# Patient Record
Sex: Female | Born: 1950 | Race: Black or African American | Hispanic: No | Smoking: Never smoker
Health system: Southern US, Community
[De-identification: ages and names within clinical notes are randomized; demographics above are authoritative.]

## PROBLEM LIST (undated history)

## (undated) DIAGNOSIS — I2510 Atherosclerotic heart disease of native coronary artery without angina pectoris: Secondary | ICD-10-CM

## (undated) DIAGNOSIS — E119 Type 2 diabetes mellitus without complications: Secondary | ICD-10-CM

## (undated) DIAGNOSIS — E049 Nontoxic goiter, unspecified: Secondary | ICD-10-CM

## (undated) DIAGNOSIS — I10 Essential (primary) hypertension: Secondary | ICD-10-CM

## (undated) HISTORY — DX: Nontoxic goiter, unspecified: E04.9

## (undated) HISTORY — DX: Atherosclerotic heart disease of native coronary artery without angina pectoris: I25.10

## (undated) HISTORY — PX: CORONARY ANGIOPLASTY WITH STENT PLACEMENT: SHX49

---

## 2016-02-13 ENCOUNTER — Encounter (HOSPITAL_BASED_OUTPATIENT_CLINIC_OR_DEPARTMENT_OTHER): Payer: Self-pay

## 2016-02-13 ENCOUNTER — Emergency Department (HOSPITAL_BASED_OUTPATIENT_CLINIC_OR_DEPARTMENT_OTHER)
Admission: EM | Admit: 2016-02-13 | Discharge: 2016-02-13 | Disposition: A | Discharge status: Home / Self Care | Payer: BLUE CROSS/BLUE SHIELD | Attending: Emergency Medicine | Admitting: Emergency Medicine

## 2016-02-13 DIAGNOSIS — Z79899 Other long term (current) drug therapy: Secondary | ICD-10-CM | POA: Insufficient documentation

## 2016-02-13 DIAGNOSIS — Z7984 Long term (current) use of oral hypoglycemic drugs: Secondary | ICD-10-CM | POA: Diagnosis not present

## 2016-02-13 DIAGNOSIS — R739 Hyperglycemia, unspecified: Secondary | ICD-10-CM

## 2016-02-13 DIAGNOSIS — E1165 Type 2 diabetes mellitus with hyperglycemia: Secondary | ICD-10-CM | POA: Insufficient documentation

## 2016-02-13 DIAGNOSIS — I10 Essential (primary) hypertension: Secondary | ICD-10-CM | POA: Diagnosis not present

## 2016-02-13 DIAGNOSIS — R197 Diarrhea, unspecified: Secondary | ICD-10-CM | POA: Insufficient documentation

## 2016-02-13 DIAGNOSIS — N390 Urinary tract infection, site not specified: Secondary | ICD-10-CM

## 2016-02-13 HISTORY — DX: Essential (primary) hypertension: I10

## 2016-02-13 HISTORY — DX: Type 2 diabetes mellitus without complications: E11.9

## 2016-02-13 LAB — HEPATIC FUNCTION PANEL
ALBUMIN: 4 g/dL (ref 3.5–5.0)
ALT: 19 U/L (ref 14–54)
AST: 18 U/L (ref 15–41)
Alkaline Phosphatase: 86 U/L (ref 38–126)
BILIRUBIN TOTAL: 0.5 mg/dL (ref 0.3–1.2)
Bilirubin, Direct: 0.1 mg/dL (ref 0.1–0.5)
Indirect Bilirubin: 0.4 mg/dL (ref 0.3–0.9)
TOTAL PROTEIN: 8.1 g/dL (ref 6.5–8.1)

## 2016-02-13 LAB — URINE MICROSCOPIC-ADD ON

## 2016-02-13 LAB — CBG MONITORING, ED
GLUCOSE-CAPILLARY: 248 mg/dL — AB (ref 65–99)
Glucose-Capillary: 294 mg/dL — ABNORMAL HIGH (ref 65–99)

## 2016-02-13 LAB — BASIC METABOLIC PANEL
ANION GAP: 8 (ref 5–15)
BUN: 23 mg/dL — ABNORMAL HIGH (ref 6–20)
CO2: 28 mmol/L (ref 22–32)
Calcium: 9.6 mg/dL (ref 8.9–10.3)
Chloride: 97 mmol/L — ABNORMAL LOW (ref 101–111)
Creatinine, Ser: 0.61 mg/dL (ref 0.44–1.00)
GFR calc Af Amer: >60 mL/min (ref >60)
Glucose, Bld: 293 mg/dL — ABNORMAL HIGH (ref 65–99)
POTASSIUM: 3.7 mmol/L (ref 3.5–5.1)
SODIUM: 133 mmol/L — AB (ref 135–145)

## 2016-02-13 LAB — CBC
HEMATOCRIT: 37.1 % (ref 36.0–46.0)
HEMOGLOBIN: 12.4 g/dL (ref 12.0–15.0)
MCH: 26.7 pg (ref 26.0–34.0)
MCHC: 33.4 g/dL (ref 30.0–36.0)
MCV: 80 fL (ref 78.0–100.0)
Platelets: 312 10*3/uL (ref 150–400)
RBC: 4.64 MIL/uL (ref 3.87–5.11)
RDW: 13.7 % (ref 11.5–15.5)
WBC: 10.6 10*3/uL — AB (ref 4.0–10.5)

## 2016-02-13 LAB — URINALYSIS, ROUTINE W REFLEX MICROSCOPIC
Bilirubin Urine: NEGATIVE
Glucose, UA: >1000 mg/dL — AB
KETONES UR: NEGATIVE mg/dL
LEUKOCYTES UA: NEGATIVE
NITRITE: NEGATIVE
PH: 5.5 (ref 5.0–8.0)
Protein, ur: 100 mg/dL — AB
SPECIFIC GRAVITY, URINE: 1.025 (ref 1.005–1.030)

## 2016-02-13 LAB — LIPASE, BLOOD: LIPASE: 16 U/L (ref 11–51)

## 2016-02-13 MED ORDER — ONDANSETRON HCL 4 MG PO TABS: 4.0000 mg | ORAL_TABLET | Freq: Three times a day (TID) | ORAL | 0 refills | Status: DC | PRN

## 2016-02-13 MED ORDER — CEPHALEXIN 500 MG PO CAPS: 500.0000 mg | ORAL_CAPSULE | Freq: Three times a day (TID) | ORAL | 0 refills | Status: DC

## 2016-02-13 MED ORDER — SODIUM CHLORIDE 0.9 % IV BOLUS (SEPSIS)
500.0000 mL | Freq: Once | INTRAVENOUS | Status: AC
Start: 2016-02-13 — End: 2016-02-13
  Administered 2016-02-13: 500 mL via INTRAVENOUS

## 2016-02-13 MED FILL — CEPHALEXIN 500 MG CAPSULE: 500 | 7 days supply | Qty: 21 | Fill #0

## 2016-02-13 MED FILL — ONDANSETRON HCL 4 MG TABLET: 4 | 3 days supply | Qty: 9 | Fill #0

## 2016-02-13 NOTE — ED Notes (Addendum)
CBG completed by NT Foxx and edit sheet faxed

## 2016-02-13 NOTE — ED Provider Notes (Signed)
[facility] Note   CSN: [id number] Arrival date & time: 02/13/16  1100     History   Chief Complaint Chief Complaint  Patient presents with  . Hyperglycemia    HPI Ellen Morris is a 65 y.o. female.  HPI Patient has history of diabetes and hypertension. States she's been noncompliant with her medications intermittently taking them. She does not have a primary physician and the medications were prescribed some time ago. Her daughter is with her and is concerned because she is having persistent loose stools for the past 2 years. Had an episode of vomiting and diarrhea last night. She denies any abdominal pain, nausea currently. No recent fever or chills. No increased urinary frequency. No chest pain or shortness of breath. Past Medical History:  Diagnosis Date  . Diabetes mellitus without complication (HCC)   . Hypertension     There are no active problems to display for this patient.   Past Surgical History:  Procedure Laterality Date  . CORONARY ANGIOPLASTY WITH STENT PLACEMENT      OB History    No data available       Home Medications    Prior to Admission medications   Medication Sig Start Date End Date Taking? Authorizing Provider  glimepiride (AMARYL) 4 MG tablet Take 4 mg by mouth daily with breakfast.   Yes Historical Provider, MD  metoprolol succinate (TOPROL-XL) 25 MG 24 hr tablet Take 25 mg by mouth daily.   Yes Historical Provider, MD  cephALEXin (KEFLEX) 500 MG capsule Take 1 capsule (500 mg total) by mouth 3 (three) times daily. 02/13/16   Loren Racer, MD  ondansetron (ZOFRAN) 4 MG tablet Take 1 tablet (4 mg total) by mouth every 8 (eight) hours as needed for nausea or vomiting. 02/13/16   Loren Racer, MD    Family History No family history on file.  Social History Social History  Substance Use Topics  . Smoking status: Never Smoker  . Smokeless tobacco: Never Used  . Alcohol use No     Allergies   Review of  patient's allergies indicates no known allergies.   Review of Systems Review of Systems  Constitutional: Negative for chills and fever.  Eyes: Negative for visual disturbance.  Respiratory: Negative for cough and shortness of breath.   Cardiovascular: Negative for chest pain, palpitations and leg swelling.  Gastrointestinal: Positive for diarrhea, nausea and vomiting. Negative for abdominal pain and blood in stool.  Genitourinary: Negative for difficulty urinating, dysuria, flank pain and frequency.  Musculoskeletal: Negative for back pain, myalgias, neck pain and neck stiffness.  Skin: Negative for rash.  Neurological: Negative for dizziness, syncope, weakness, light-headedness, numbness and headaches.  All other systems reviewed and are negative.    Physical Exam Updated Vital Signs BP 164/92 (BP Location: Right Arm)   Pulse 82   Temp 98.1 F (36.7 C) (Oral)   Resp 18   SpO2 100%   Physical Exam  Constitutional: She is oriented to person, place, and time. She appears well-developed and well-nourished. No distress.  HENT:  Head: Normocephalic and atraumatic.  Mouth/Throat: Oropharynx is clear and moist.  Moist mucous membranes  Eyes: EOM are normal. Pupils are equal, round, and reactive to light.  Neck: Normal range of motion. Neck supple. No JVD present.  Cardiovascular: Normal rate, regular rhythm, normal heart sounds and intact distal pulses.  Exam reveals no gallop and no friction rub.   No murmur heard. Pulmonary/Chest: Effort normal and breath sounds normal. No  respiratory distress. She has no wheezes. She has no rales. She exhibits no tenderness.  Abdominal: Soft. Bowel sounds are normal. She exhibits no distension and no mass. There is no tenderness. There is no rebound and no guarding. No hernia.  Musculoskeletal: Normal range of motion. She exhibits no edema or tenderness.  No CVA tenderness. No lower extremity swelling, asymmetry or tenderness. Distal pulses are  2+.  Neurological: She is alert and oriented to person, place, and time.  5/5 motor in all extremities. Sensation is intact.  Skin: Skin is warm and dry. Capillary refill takes less than 2 seconds. No rash noted. She is not diaphoretic. No erythema.  Psychiatric: She has a normal mood and affect. Her behavior is normal.  Nursing note and vitals reviewed.    ED Treatments / Results  Labs (all labs ordered are listed, but only abnormal results are displayed) Labs Reviewed  BASIC METABOLIC PANEL  CBC  URINALYSIS, ROUTINE W REFLEX MICROSCOPIC (NOT AT [facility])  HEPATIC FUNCTION PANEL  LIPASE, BLOOD  CBG MONITORING, ED    EKG  EKG Interpretation None       Radiology No results found.  Procedures Procedures (including critical care time)  Medications Ordered in ED Medications  sodium chloride 0.9 % bolus 500 mL (0 mLs Intravenous Stopped 02/13/16 1241)     Initial Impression / Assessment and Plan / ED Course  I have reviewed the triage vital signs and the nursing notes.  Pertinent labs & imaging results that were available during my care of the patient were reviewed by me and considered in my medical decision making (see chart for details).  Clinical Course   Patient's well-appearing. Stable vital signs. Will check basic labs. I have reinforced the importance of establishing care with a primary physician to manage long-term medical conditions. Patient has verbally agreed that she will make an appointment to follow-up.  Final Clinical Impressions(s) / ED Diagnoses   Final diagnoses:  Hyperglycemia, unspecified  UTI (lower urinary tract infection)   Blood glucose is improved after IV fluids. Patient has many bacteria on her UA. States she's been having some urinary hesitancy. We'll treat for UTI. She will establish care with a primary physician in follow-up. Return precautions given. New Prescriptions New Prescriptions   CEPHALEXIN (KEFLEX) 500 MG CAPSULE    Take 1  capsule (500 mg total) by mouth 3 (three) times daily.   ONDANSETRON (ZOFRAN) 4 MG TABLET    Take 1 tablet (4 mg total) by mouth every 8 (eight) hours as needed for nausea or vomiting.     Loren Raceravid Kou Gucciardo, MD 02/13/16 [phone]

## 2016-02-13 NOTE — ED Notes (Addendum)
Patient reports that after eating certain foods like collard greens and peanuts she develops this uncontrollable diarrhea. Denies any food allergies. Denies any issues with spicy foods. Patient reports she does still have her gallbladder. Patient also reports generalized fatigue. Patient reports last night was having v/d together at same time.

## 2016-02-13 NOTE — ED Notes (Signed)
CBG 294 

## 2016-02-13 NOTE — ED Triage Notes (Addendum)
Pt c/o BS "out of whack for a long while"-states she takes oral meds-also c/o diarrhea weekly x "over a year"-does not have PCP-NAD-steady gait

## 2016-02-13 NOTE — ED Notes (Signed)
MD at bedside. 

## 2016-03-05 ENCOUNTER — Encounter: Payer: Self-pay | Admitting: Family

## 2016-03-05 ENCOUNTER — Telehealth: Payer: Self-pay | Admitting: Family

## 2016-03-05 ENCOUNTER — Ambulatory Visit (INDEPENDENT_AMBULATORY_CARE_PROVIDER_SITE_OTHER): Payer: BLUE CROSS/BLUE SHIELD | Admitting: Family

## 2016-03-05 VITALS — BP 158/75 | HR 90 | Temp 98.3°F | Resp 18 | Ht 63.5 in | Wt 146.2 lb

## 2016-03-05 DIAGNOSIS — E785 Hyperlipidemia, unspecified: Secondary | ICD-10-CM | POA: Diagnosis not present

## 2016-03-05 DIAGNOSIS — E113393 Type 2 diabetes mellitus with moderate nonproliferative diabetic retinopathy without macular edema, bilateral: Secondary | ICD-10-CM

## 2016-03-05 DIAGNOSIS — E119 Type 2 diabetes mellitus without complications: Secondary | ICD-10-CM | POA: Insufficient documentation

## 2016-03-05 DIAGNOSIS — Z9861 Coronary angioplasty status: Secondary | ICD-10-CM | POA: Insufficient documentation

## 2016-03-05 DIAGNOSIS — I2510 Atherosclerotic heart disease of native coronary artery without angina pectoris: Secondary | ICD-10-CM

## 2016-03-05 DIAGNOSIS — I25810 Atherosclerosis of coronary artery bypass graft(s) without angina pectoris: Secondary | ICD-10-CM | POA: Diagnosis not present

## 2016-03-05 DIAGNOSIS — I10 Essential (primary) hypertension: Secondary | ICD-10-CM | POA: Diagnosis not present

## 2016-03-05 DIAGNOSIS — E113599 Type 2 diabetes mellitus with proliferative diabetic retinopathy without macular edema, unspecified eye: Secondary | ICD-10-CM

## 2016-03-05 DIAGNOSIS — E1165 Type 2 diabetes mellitus with hyperglycemia: Secondary | ICD-10-CM | POA: Diagnosis not present

## 2016-03-05 DIAGNOSIS — E1139 Type 2 diabetes mellitus with other diabetic ophthalmic complication: Secondary | ICD-10-CM | POA: Insufficient documentation

## 2016-03-05 HISTORY — DX: Atherosclerotic heart disease of native coronary artery without angina pectoris: I25.10

## 2016-03-05 LAB — BASIC METABOLIC PANEL
BUN: 15 mg/dL (ref 6–23)
CALCIUM: 9.4 mg/dL (ref 8.4–10.5)
CO2: 31 mEq/L (ref 19–32)
Chloride: 99 mEq/L (ref 96–112)
Creatinine, Ser: 0.6 mg/dL (ref 0.40–1.20)
GFR: 129.21 mL/min (ref >60.00)
GLUCOSE: 208 mg/dL — AB (ref 70–99)
POTASSIUM: 3.7 meq/L (ref 3.5–5.1)
SODIUM: 137 meq/L (ref 135–145)

## 2016-03-05 LAB — HEPATIC FUNCTION PANEL
ALBUMIN: 3.7 g/dL (ref 3.5–5.2)
ALK PHOS: 92 U/L (ref 39–117)
ALT: 18 U/L (ref 0–35)
AST: 14 U/L (ref 0–37)
Bilirubin, Direct: 0 mg/dL (ref 0.0–0.3)
TOTAL PROTEIN: 7.5 g/dL (ref 6.0–8.3)
Total Bilirubin: 0.3 mg/dL (ref 0.2–1.2)

## 2016-03-05 LAB — MICROALBUMIN / CREATININE URINE RATIO
CREATININE, U: 94.1 mg/dL
MICROALB UR: 39.9 mg/dL — AB (ref 0.0–1.9)
MICROALB/CREAT RATIO: 42.4 mg/g — AB (ref 0.0–30.0)

## 2016-03-05 LAB — HEMOGLOBIN A1C: HEMOGLOBIN A1C: 8.5 % — AB (ref 4.6–6.5)

## 2016-03-05 MED ORDER — LOSARTAN POTASSIUM 25 MG PO TABS: 25.0000 mg | ORAL_TABLET | Freq: Every day | ORAL | 3 refills | Status: DC

## 2016-03-05 MED ORDER — SITAGLIPTIN PHOSPHATE 100 MG PO TABS
100.0000 mg | ORAL_TABLET | Freq: Every day | ORAL | 5 refills | Status: DC
Start: 2016-03-05 — End: 2017-07-11

## 2016-03-05 NOTE — Telephone Encounter (Signed)
Rx faxed to below pharmacy. Pt also left her medications in the exam room today. Advised pt I would leave them at the front desk for pick up and she states she will pick them up today.

## 2016-03-05 NOTE — Progress Notes (Signed)
Subjective:    Patient ID: Ellen Morris, female    DOB: 08-02-1950, 65 y.o.   MRN: [id number]  HPI  [name] is a 65 yr old female who presents today to establish care.    DM2- reports that she was diagnosed >10 yrs ago.  She takes amaryl. Reports that she checks her sugars frequently.  Reports that last night sugar before dinner was 136. Was 229  This AM. Had her eyes examined last month and was told that she has diabetic retinopathy.  She restarted medications in March.  She eats oatmeal for breakfast.   HTN- She has not taken metoprolol in 2 weeks.  Notes that it makes her tired. Intolerant to lisinopril, she denies hx of tongue/lip swelling on lisinopril. No hives or rash.   BP Readings from Last 3 Encounters:  03/05/16 (!) 158/75  02/13/16 148/78   CAD- She denies chest pain.  She would like referral to cardiology.   Review of Systems See HPI    Past Medical History:  Diagnosis Date  . Diabetes mellitus without complication (HCC)   . Hypertension      Social History   Social History  . Marital status: Single    Spouse name: N/A  . Number of children: N/A  . Years of education: N/A   Occupational History  . Not on file.   Social History Main Topics  . Smoking status: Never Smoker  . Smokeless tobacco: Never Used  . Alcohol use Yes     Comment: occasional wine  . Drug use: No  . Sexual activity: Not on file   Other Topics Concern  . 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     Past Surgical History:  Procedure Laterality Date  . CORONARY ANGIOPLASTY WITH STENT PLACEMENT      Family History  Problem Relation Age of Onset  . Diabetes Mother   . Hypertension Mother   . Heart disease Father     Allergies  Allergen Reactions  . Metformin Diarrhea  . Atorvastatin     Other reaction(s): Other (See  Comments) Severe fatigue and bones hurt  . Lisinopril     "made me feel funny"    Current Outpatient Prescriptions on File Prior to Visit  Medication Sig Dispense Refill  . glimepiride (AMARYL) 4 MG tablet Take 4 mg by mouth daily with breakfast.     No current facility-administered medications on file prior to visit.     BP (!) 158/75   Pulse 90   Temp 98.3 F (36.8 C) (Oral)   Resp 18   Ht 5' 3.5" (1.613 m)   Wt 146 lb 3.2 oz (66.3 kg)   SpO2 100% Comment: room air  BMI 25.49 kg/m    Objective:   Physical Exam  Constitutional: She is oriented to person, place, and time. She appears well-developed and well-nourished.  HENT:  Right Ear: Tympanic membrane and ear canal normal.  Left Ear: Tympanic membrane and ear canal normal.  Mouth/Throat: No oropharyngeal exudate, posterior oropharyngeal edema or posterior oropharyngeal erythema.  Cardiovascular: Normal rate, regular rhythm and normal heart sounds.   No murmur heard. Pulmonary/Chest: Effort normal and breath sounds normal. No respiratory distress. She has no wheezes.  Lymphadenopathy:    She has no cervical adenopathy.  Neurological: She is alert and oriented to person, place, and time.  Psychiatric: She has a normal mood and affect. Her behavior is normal. Judgment and thought content normal.          Assessment & Plan:  30 min spent with pt today. >50% of this time was spent counseling patient on diabetes, hypertension and diabetic diet.

## 2016-03-05 NOTE — Patient Instructions (Addendum)
Stop metoprolol, start losartan. Add walking 30 minutes walking 5 days a week.  Welcome to [facility]

## 2016-03-05 NOTE — Assessment & Plan Note (Signed)
Clinically stable. Refer to cardiology for ongoing management.  Continue aspirin.  May need to add back in beta blocker due to CAD next visit if BP remains elevated. Also, lipid panel was not processed today. Will need to obtain next visit.

## 2016-03-05 NOTE — Telephone Encounter (Signed)
Pt would like to have a Rx go to her local pharmacy. She says that it will take a few days before she receive medication if it travels via [facility]?  (losartan)   [facility] [address]

## 2016-03-05 NOTE — Telephone Encounter (Signed)
A1C is 8.5, needs to be <7. I would like her to continue amaryl, add [place]uvia once daily. There is some protein in her urine indicating some kidney damage from the diabetes.  The losartan that we added along with improved sugar control should decrease further damage to her kidneys.

## 2016-03-05 NOTE — Assessment & Plan Note (Signed)
Uncontrolled.  + urine microalbuminuria noted today. D/c metoprolol, begin losartan.

## 2016-03-05 NOTE — Progress Notes (Signed)
Pre visit review using our clinic review tool, if applicable. No additional management support is needed unless otherwise documented below in the visit note. 

## 2016-03-05 NOTE — Assessment & Plan Note (Signed)
Lab Results  Component Value Date   HGBA1C 8.5 (H) 03/05/2016   Follow up A1C is above goal on amaryl. Add [place]uvia. Discussed diabetic diet.

## 2016-03-06 NOTE — Telephone Encounter (Signed)
Left detailed message on voicemail per DPR and to call if any questions.

## 2016-03-22 ENCOUNTER — Ambulatory Visit (INDEPENDENT_AMBULATORY_CARE_PROVIDER_SITE_OTHER): Payer: BLUE CROSS/BLUE SHIELD | Admitting: Family

## 2016-03-22 ENCOUNTER — Encounter: Payer: Self-pay | Admitting: Family

## 2016-03-22 VITALS — BP 140/82 | HR 72 | Temp 98.1°F | Resp 16 | Ht 63.5 in | Wt 142.8 lb

## 2016-03-22 DIAGNOSIS — Z23 Encounter for immunization: Secondary | ICD-10-CM

## 2016-03-22 DIAGNOSIS — I159 Secondary hypertension, unspecified: Secondary | ICD-10-CM

## 2016-03-22 DIAGNOSIS — I10 Essential (primary) hypertension: Secondary | ICD-10-CM

## 2016-03-22 LAB — BASIC METABOLIC PANEL
BUN: 25 mg/dL — ABNORMAL HIGH (ref 6–23)
CALCIUM: 9.7 mg/dL (ref 8.4–10.5)
CO2: 29 mEq/L (ref 19–32)
Chloride: 101 mEq/L (ref 96–112)
Creatinine, Ser: 0.72 mg/dL (ref 0.40–1.20)
GFR: 104.68 mL/min (ref >60.00)
Glucose, Bld: 217 mg/dL — ABNORMAL HIGH (ref 70–99)
POTASSIUM: 3.8 meq/L (ref 3.5–5.1)
SODIUM: 136 meq/L (ref 135–145)

## 2016-03-22 NOTE — Progress Notes (Signed)
   Subjective:    Patient ID: Ellen Morris, female    DOB: Jul 05, 1950, 65 y.o.   MRN: [id number]  HPI  Ellen Morris is a 65 yr old female who presents today for follow up of her hypertension.  HTN- Currently maintained on losartan 25mg . This was added last visit due to microalbuminuria and uncontrolled HTN.  BP Readings from Last 3 Encounters:  03/22/16 140/82  03/05/16 (!) 158/75  02/13/16 148/78   Denies CP/SOB or swelling.      Review of Systems See HPI  Past Medical History:  Diagnosis Date  . CAD (coronary artery disease) 03/05/2016  . Diabetes mellitus without complication (HCC)   . Hypertension      Social History   Social History  . Marital status: Single    Spouse name: N/A  . Number of children: N/A  . Years of education: N/A   Occupational History  . Not on file.   Social History Main Topics  . Smoking status: Never Smoker  . Smokeless tobacco: Never Used  . Alcohol use Yes     Comment: occasional wine  . Drug use: No  . Sexual activity: Not on file   Other Topics Concern  . 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     Past Surgical History:  Procedure Laterality Date  . CORONARY ANGIOPLASTY WITH STENT PLACEMENT      Family History  Problem Relation Age of Onset  . Diabetes Mother   . Hypertension Mother   . Heart disease Father     Allergies  Allergen Reactions  . Metformin Diarrhea  . Atorvastatin     Other reaction(s): Other (See Comments) Severe fatigue and bones hurt  . Lisinopril     "made me feel funny"    Current Outpatient Prescriptions on File Prior to Visit  Medication Sig Dispense Refill  . aspirin EC 81 MG tablet Take 81 mg by mouth daily.    Marland Kitchen. glimepiride (AMARYL) 4 MG tablet Take 4 mg by mouth daily with breakfast.    . losartan (COZAAR) 25 MG tablet Take 1 tablet (25  mg total) by mouth daily. 30 tablet 3  . sitaGLIPtin (JANUVIA) 100 MG tablet Take 1 tablet (100 mg total) by mouth daily. 30 tablet 5   No current facility-administered medications on file prior to visit.     BP 140/82   Pulse 72   Temp 98.1 F (36.7 C) (Oral)   Resp 16   Ht 5' 3.5" (1.613 m)   Wt 142 lb 12.8 oz (64.8 kg)   SpO2 97% Comment: room air  BMI 24.90 kg/m       Objective:   Physical Exam  Constitutional: She appears well-developed and well-nourished.  Cardiovascular: Normal rate, regular rhythm and normal heart sounds.   No murmur heard. Pulmonary/Chest: Effort normal and breath sounds normal. No respiratory distress. She has no wheezes.  Musculoskeletal: She exhibits no edema.  Psychiatric: She has a normal mood and affect. Her behavior is normal. Judgment and thought content normal.          Assessment & Plan:

## 2016-03-22 NOTE — Patient Instructions (Signed)
Please complete lab work prior to leaving.   

## 2016-03-22 NOTE — Progress Notes (Signed)
Pre visit review using our clinic review tool, if applicable. No additional management support is needed unless otherwise documented below in the visit note. 

## 2016-03-23 NOTE — Assessment & Plan Note (Signed)
BP stable on losartan, continue same, obtain bmet.

## 2016-05-17 ENCOUNTER — Ambulatory Visit: Payer: BLUE CROSS/BLUE SHIELD | Admitting: Interventional Cardiology

## 2016-06-06 NOTE — Progress Notes (Deleted)
     HPI: 66 year old female for evaluation of coronary artery disease. [facility].   Current Outpatient Prescriptions  Medication Sig Dispense Refill  . aspirin EC 81 MG tablet Take 81 mg by mouth daily.    Marland Kitchen. glimepiride (AMARYL) 4 MG tablet Take 4 mg by mouth daily with breakfast.    . losartan (COZAAR) 25 MG tablet Take 1 tablet (25 mg total) by mouth daily. 30 tablet 3  . sitaGLIPtin (JANUVIA) 100 MG tablet Take 1 tablet (100 mg total) by mouth daily. 30 tablet 5   No current facility-administered medications for this visit.     Allergies  Allergen Reactions  . Metformin Diarrhea  . Atorvastatin     Other reaction(s): Other (See Comments) Severe fatigue and bones hurt  . Lisinopril     "made me feel funny"    Past Medical History:  Diagnosis Date  . CAD (coronary artery disease) 03/05/2016  . Diabetes mellitus without complication (HCC)   . Hypertension     Past Surgical History:  Procedure Laterality Date  . CORONARY ANGIOPLASTY WITH STENT PLACEMENT      Social History   Social History  . Marital status: Single    Spouse name: N/A  . Number of children: N/A  . Years of education: N/A   Occupational History  . Not on file.   Social History Main Topics  . Smoking status: Never Smoker  . Smokeless tobacco: Never Used  . Alcohol use Yes     Comment: occasional wine  . Drug use: No  . Sexual activity: Not on file   Other Topics Concern  . 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     Family History  Problem Relation Age of Onset  . Diabetes Mother   . Hypertension Mother   . Heart disease Father     ROS: no fevers or chills, productive cough, hemoptysis, dysphasia, odynophagia, melena, hematochezia, dysuria, hematuria, rash, seizure activity, orthopnea, PND, pedal edema,  claudication. Remaining systems are negative.  Physical Exam:   There were no vitals taken for this visit.  General:  Well developed/well nourished in NAD Skin warm/dry Patient not depressed No peripheral clubbing Back-normal HEENT-normal/normal eyelids Neck supple/normal carotid upstroke bilaterally; no bruits; no JVD; no thyromegaly chest - CTA/ normal expansion CV - RRR/normal S1 and S2; no murmurs, rubs or gallops;  PMI nondisplaced Abdomen -NT/ND, no HSM, no mass, + bowel sounds, no bruit 2+ femoral pulses, no bruits Ext-no edema, chords, 2+ DP Neuro-grossly nonfocal  ECG

## 2016-06-07 ENCOUNTER — Ambulatory Visit (INDEPENDENT_AMBULATORY_CARE_PROVIDER_SITE_OTHER): Payer: BLUE CROSS/BLUE SHIELD | Admitting: Family

## 2016-06-07 ENCOUNTER — Encounter: Payer: Self-pay | Admitting: Family

## 2016-06-07 VITALS — BP 155/98 | HR 94 | Temp 98.2°F | Ht 63.5 in | Wt 144.0 lb

## 2016-06-07 DIAGNOSIS — R197 Diarrhea, unspecified: Secondary | ICD-10-CM | POA: Diagnosis not present

## 2016-06-07 DIAGNOSIS — E1165 Type 2 diabetes mellitus with hyperglycemia: Secondary | ICD-10-CM

## 2016-06-07 DIAGNOSIS — E113599 Type 2 diabetes mellitus with proliferative diabetic retinopathy without macular edema, unspecified eye: Secondary | ICD-10-CM

## 2016-06-07 DIAGNOSIS — I10 Essential (primary) hypertension: Secondary | ICD-10-CM | POA: Diagnosis not present

## 2016-06-07 DIAGNOSIS — E049 Nontoxic goiter, unspecified: Secondary | ICD-10-CM

## 2016-06-07 LAB — CBC WITH DIFFERENTIAL/PLATELET
BASOS ABS: 0 {cells}/uL (ref 0–200)
Basophils Relative: 0 %
EOS ABS: 84 {cells}/uL (ref 15–500)
Eosinophils Relative: 1 %
HEMATOCRIT: 37.3 % (ref 35.0–45.0)
Hemoglobin: 11.8 g/dL (ref 11.7–15.5)
LYMPHS PCT: 29 %
Lymphs Abs: 2436 cells/uL (ref 850–3900)
MCH: 26.5 pg — ABNORMAL LOW (ref 27.0–33.0)
MCHC: 31.6 g/dL — AB (ref 32.0–36.0)
MCV: 83.6 fL (ref 80.0–100.0)
MONO ABS: 756 {cells}/uL (ref 200–950)
MONOS PCT: 9 %
MPV: 10.2 fL (ref 7.5–12.5)
Neutro Abs: 5124 cells/uL (ref 1500–7800)
Neutrophils Relative %: 61 %
PLATELETS: 329 10*3/uL (ref 140–400)
RBC: 4.46 MIL/uL (ref 3.80–5.10)
RDW: 13.7 % (ref 11.0–15.0)
WBC: 8.4 10*3/uL (ref 3.8–10.8)

## 2016-06-07 LAB — COMPREHENSIVE METABOLIC PANEL
ALBUMIN: 3.8 g/dL (ref 3.6–5.1)
ALK PHOS: 100 U/L (ref 33–130)
ALT: 14 U/L (ref 6–29)
AST: 13 U/L (ref 10–35)
BILIRUBIN TOTAL: 0.3 mg/dL (ref 0.2–1.2)
BUN: 22 mg/dL (ref 7–25)
CALCIUM: 9.8 mg/dL (ref 8.6–10.4)
CO2: 27 mmol/L (ref 20–31)
CREATININE: 0.64 mg/dL (ref 0.50–0.99)
Chloride: 102 mmol/L (ref 98–110)
GLUCOSE: 234 mg/dL — AB (ref 65–99)
Potassium: 4.1 mmol/L (ref 3.5–5.3)
SODIUM: 136 mmol/L (ref 135–146)
Total Protein: 7.6 g/dL (ref 6.1–8.1)

## 2016-06-07 LAB — T3, FREE: T3 FREE: 3.2 pg/mL (ref 2.3–4.2)

## 2016-06-07 LAB — HEMOGLOBIN A1C
Hgb A1c MFr Bld: 9.1 % — ABNORMAL HIGH (ref <5.7)
Mean Plasma Glucose: 214 mg/dL

## 2016-06-07 LAB — TSH: TSH: 0.45 m[IU]/L

## 2016-06-07 LAB — T4, FREE: Free T4: 1.3 ng/dL (ref 0.8–1.8)

## 2016-06-07 MED ORDER — LOSARTAN POTASSIUM 50 MG PO TABS: 50.0000 mg | ORAL_TABLET | Freq: Every day | ORAL | 0 refills | Status: DC

## 2016-06-07 NOTE — Progress Notes (Signed)
Pre visit review using our clinic tool,if applicable. No additional management support is needed unless otherwise documented below in the visit note.  

## 2016-06-07 NOTE — Patient Instructions (Addendum)
We have increased your Losartan to 50 mg daily. You may take two of your 25 mg Losartans if you'd like. Continue taking this medication. If you develop headaches, slurred speech, weakness, blurred vision --> go to the emergency room!  Start Januvia tomorrow and then start Amaryl a few days later, call Melissa if recurrent diarrhea.  Make a follow-up appointment to see Melissa in 2 weeks.  Complete your lab work and stool studies.  You will be contacted about performing the ultrasound for your thyroid.

## 2016-06-07 NOTE — Progress Notes (Signed)
Subjective:    Patient ID: Leo Rodamela Celaya, female    DOB: 1950-10-15, 66 y.o.   MRN: [id number]  HPI  Ms. Jenelle MagesHairston is a 66 y/o female who presents with two chief complaints:  1) Goiter - On January 3rd, she fell at work. They completed an xray, and they incidentally found a goiter on the right side of her neck and she is here today for a referral and follow-up on that. She denies any weight loss, temperature intolerances at this time.  2) Diarrhea - She has been having debilitating uncontrollable diarrhea since October. She states that about two weeks after starting both the Cozaar and Januvia she noticed she had episodes of diarrhea. Once the episodes started they would last for several hours, at least twelve hours, and it would keep her running to the bathroom to the point where she would have to wear Depends and was unable to travel far from home. She tried Imodium, which helped, but didn't want to have to take it all of the time so she stopped taking it. She states that she has been able to stay well hydrated. She does notice that broccoli, nuts and popcorn makes it worse. She denies fever, cramping, weight loss, night sweats, sick contacts, blood in diarrhea. She did have two episodes of vomiting a month or two ago, non-bilious non-bloody. She does find that eating with the medication seemed to help her symptoms some of the time. Last colonoscopy in 2013, was told to repeat in 2023 as there were no findings. She states that prior to cozaar she was on metoprolol and notes that she had diarrhea on that medication as well. She has checked her blood sugars two weeks ago and they ranged from 120-130. She states that other than taking her Cozaar once today, she has not taken any of her medications in two weeks and has not had any diarrhea since doing so.  Review of Systems  See HPI  Past Medical History:  Diagnosis Date  . CAD (coronary artery disease) 03/05/2016  . Diabetes mellitus without  complication (HCC)   . Hypertension      Social History   Social History  . Marital status: Single    Spouse name: N/A  . Number of children: N/A  . Years of education: N/A   Occupational History  . Not on file.   Social History Main Topics  . Smoking status: Never Smoker  . Smokeless tobacco: Never Used  . Alcohol use Yes     Comment: occasional wine  . Drug use: No  . Sexual activity: Not on file   Other Topics Concern  . 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     Past Surgical History:  Procedure Laterality Date  . CORONARY ANGIOPLASTY WITH STENT PLACEMENT      Family History  Problem Relation Age of Onset  . Diabetes Mother   . Hypertension Mother   . Heart disease Father     Allergies  Allergen Reactions  . Metformin Diarrhea  . Atorvastatin     Other reaction(s): Other (See Comments) Severe fatigue and bones hurt  . Lisinopril     "made me feel funny"    Current Outpatient Prescriptions on File Prior to Visit  Medication Sig Dispense Refill  . aspirin  EC 81 MG tablet Take 81 mg by mouth daily.    Marland Kitchen glimepiride (AMARYL) 4 MG tablet Take 4 mg by mouth daily with breakfast.    . losartan (COZAAR) 25 MG tablet Take 1 tablet (25 mg total) by mouth daily. 30 tablet 3  . sitaGLIPtin (JANUVIA) 100 MG tablet Take 1 tablet (100 mg total) by mouth daily. 30 tablet 5   No current facility-administered medications on file prior to visit.     Pulse 94   Temp 98.2 F (36.8 C) (Oral)   Ht 5' 3.5" (1.613 m)   Wt 144 lb (65.3 kg)   SpO2 98%   BMI 25.11 kg/m   BP 155/98    Objective:   Physical Exam  Constitutional: She appears well-developed and well-nourished.  HENT:  Head: Normocephalic and atraumatic.  Neck: Trachea normal. Mass: very slight, R lower side of anterior area of neck.    Cardiovascular:  Normal rate, regular rhythm and normal heart sounds.   Pulmonary/Chest: Effort normal and breath sounds normal.  Abdominal: Soft. Normal appearance and bowel sounds are normal. There is no tenderness.  Nursing note and vitals reviewed.      Assessment & Plan:  1. Diarrhea, unspecified type Unable to determine if medication related, however it is possible given no symptoms while not on any medications. Will rule out infectious or other component with lab testing. Patient to complete stool culture. - Comprehensive metabolic panel - Lipase - CBC with Differential/Platelet - Hemoglobin A1c - Ova and parasite examination - Clostridium Difficile by PCR - Stool Culture  2. Goiter No current symptoms. Will schedule routine thyroid blood work and ultrasound. Further work-up pending results. - TSH - T3, free - T4, free - US THYROID; Future  3. Essential hypertension Blood pressure today is elevated, and was checked several times. Discussed need to take BP medications consistently. Increased Cozaar dose to 50mg . Patient to follow up with Sandford Craze in two weeks. Advised as follows:  If you develop headaches, slurred speech, weakness, blurred vision --> go to the emergency room!   4. 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 Will re-check today. Patient is to restart Januvia and then if tolerated, restart Amaryl. If diarrhea develops, patient is to contact Melissa. Check blood sugars daily. Follow-up in two weeks.  Jarold Motto PA-C 06/07/16

## 2016-06-08 LAB — LIPASE: Lipase: <5 U/L — ABNORMAL LOW (ref 7–60)

## 2016-06-09 ENCOUNTER — Ambulatory Visit (HOSPITAL_BASED_OUTPATIENT_CLINIC_OR_DEPARTMENT_OTHER)
Admission: RE | Admit: 2016-06-09 | Discharge: 2016-06-09 | Disposition: A | Discharge status: Home / Self Care | Payer: BLUE CROSS/BLUE SHIELD | Source: Ambulatory Visit | Attending: Physician Assistant | Admitting: Physician Assistant

## 2016-06-09 DIAGNOSIS — E042 Nontoxic multinodular goiter: Secondary | ICD-10-CM | POA: Insufficient documentation

## 2016-06-09 DIAGNOSIS — E049 Nontoxic goiter, unspecified: Secondary | ICD-10-CM | POA: Diagnosis present

## 2016-06-11 ENCOUNTER — Other Ambulatory Visit: Payer: Self-pay | Admitting: Physician Assistant

## 2016-06-11 DIAGNOSIS — E1165 Type 2 diabetes mellitus with hyperglycemia: Principal | ICD-10-CM

## 2016-06-11 DIAGNOSIS — E113393 Type 2 diabetes mellitus with moderate nonproliferative diabetic retinopathy without macular edema, bilateral: Secondary | ICD-10-CM

## 2016-06-17 ENCOUNTER — Ambulatory Visit: Payer: BLUE CROSS/BLUE SHIELD | Admitting: Cardiology

## 2016-06-21 ENCOUNTER — Ambulatory Visit: Payer: BLUE CROSS/BLUE SHIELD | Admitting: Family

## 2016-06-21 ENCOUNTER — Encounter: Payer: Self-pay | Admitting: Family

## 2016-06-21 ENCOUNTER — Ambulatory Visit (INDEPENDENT_AMBULATORY_CARE_PROVIDER_SITE_OTHER): Payer: BLUE CROSS/BLUE SHIELD | Admitting: Family

## 2016-06-21 VITALS — BP 137/84 | HR 85 | Temp 98.0°F | Resp 16 | Ht 63.5 in | Wt 146.2 lb

## 2016-06-21 DIAGNOSIS — I10 Essential (primary) hypertension: Secondary | ICD-10-CM

## 2016-06-21 DIAGNOSIS — E042 Nontoxic multinodular goiter: Secondary | ICD-10-CM

## 2016-06-21 DIAGNOSIS — E1165 Type 2 diabetes mellitus with hyperglycemia: Secondary | ICD-10-CM

## 2016-06-21 DIAGNOSIS — E113393 Type 2 diabetes mellitus with moderate nonproliferative diabetic retinopathy without macular edema, bilateral: Secondary | ICD-10-CM

## 2016-06-21 DIAGNOSIS — E785 Hyperlipidemia, unspecified: Secondary | ICD-10-CM

## 2016-06-21 NOTE — Assessment & Plan Note (Signed)
Lab Results  Component Value Date   TSH 0.45 06/07/2016   Advised pt to follow through with endocrinology referral.

## 2016-06-21 NOTE — Assessment & Plan Note (Signed)
BP improved today on increased dose of losartan.

## 2016-06-21 NOTE — Assessment & Plan Note (Signed)
Improving with improved diet and back on meds. I encouraged pt to keep up the good work with diet and med compliance. I don't think that her diarrhea symptoms were med related- monitor.

## 2016-06-21 NOTE — Patient Instructions (Addendum)
Please complete lab work prior to leaving. Continue your work on diabetic diet.  Keep your upcoming appointment with endocrinology.  Follow up in 3 months.

## 2016-06-21 NOTE — Progress Notes (Signed)
Pre visit review using our clinic review tool, if applicable. No additional management support is needed unless otherwise documented below in the visit note. 

## 2016-06-21 NOTE — Progress Notes (Signed)
Dictation #1 [id number]  [id number]  Subjective:    Patient ID: Ellen Morris, female    DOB: 24-Apr-1951, 66 y.o.   MRN: [id number]  HPI  Ellen Morris is a 66 yr old female who presents today for follow up.  1) DM2- last visit she was referred to endocrinology due to her worsening diabetic control. She reports that she has been cutting back on the foods that she eats.  "I am now accepting the fact that I am a diabetic." Reports she is motivated to take better care of herself. Reports that her fasting sugars are ranging 116-140's.   Lab Results  Component Value Date   HGBA1C 9.1 (H) 06/07/2016   HGBA1C 8.5 (H) 03/05/2016   Lab Results  Component Value Date   MICROALBUR 39.9 (H) 03/05/2016   CREATININE 0.64 06/07/2016   2) HTN- currently maintained on losartan 50mg .  BP Readings from Last 3 Encounters:  06/21/16 137/84  06/07/16 (!) 155/98  03/22/16 140/82   3) goiter- last visit she was noted to have a multinodular goiter and was referred to endocrinology.   4) Diarrhea- she reports resolution of her diarrhea. She has resumed all her meds.    Review of Systems    see HPI  Past Medical History:  Diagnosis Date  . CAD (coronary artery disease) 03/05/2016  . Diabetes mellitus without complication (HCC)   . Hypertension      Social History   Social History  . Marital status: Single    Spouse name: N/A  . Number of children: N/A  . Years of education: N/A   Occupational History  . Not on file.   Social History Main Topics  . Smoking status: Never Smoker  . Smokeless tobacco: Never Used  . Alcohol use Yes     Comment: occasional wine  . Drug use: No  . Sexual activity: Not on file   Other Topics Concern  . 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     Past Surgical History:  Procedure Laterality Date  .  CORONARY ANGIOPLASTY WITH STENT PLACEMENT      Family History  Problem Relation Age of Onset  . Diabetes Mother   . Hypertension Mother   . Heart disease Father     Allergies  Allergen Reactions  . Metformin Diarrhea  . Atorvastatin     Other reaction(s): Other (See Comments) Severe fatigue and bones hurt  . Lisinopril     "made me feel funny"    Current Outpatient Prescriptions on File Prior to Visit  Medication Sig Dispense Refill  . aspirin EC 81 MG tablet Take 81 mg by mouth daily.    Marland Kitchen glimepiride (AMARYL) 4 MG tablet Take 4 mg by mouth daily with breakfast.    . losartan (COZAAR) 50 MG tablet Take 1 tablet (50 mg total) by mouth daily. 30 tablet 0  . sitaGLIPtin (JANUVIA) 100 MG tablet Take 1 tablet (100 mg total) by mouth daily. 30 tablet 5   No current facility-administered medications on file prior to visit.     BP 137/84 (BP Location: Right Arm, Cuff Size: Normal)   Pulse 85   Temp 98 F (36.7 C) (Oral)   Resp 16   Ht 5' 3.5" (1.613 m)   Wt 146 lb 3.2 oz (66.3  kg)   SpO2 100% Comment: room air  BMI 25.49 kg/m    Objective:   Physical Exam  Constitutional: She is oriented to person, place, and time. She appears well-developed and well-nourished.  Cardiovascular: Normal rate, regular rhythm and normal heart sounds.   No murmur heard. Pulmonary/Chest: Effort normal and breath sounds normal. No respiratory distress. She has no wheezes.  Musculoskeletal: She exhibits no edema.  Neurological: She is alert and oriented to person, place, and time.  Psychiatric: She has a normal mood and affect. Her behavior is normal. Judgment and thought content normal.          Assessment & Plan:

## 2016-06-22 LAB — BASIC METABOLIC PANEL
BUN: 30 mg/dL — AB (ref 7–25)
CO2: 26 mmol/L (ref 20–31)
Calcium: 9.5 mg/dL (ref 8.6–10.4)
Chloride: 103 mmol/L (ref 98–110)
Creat: 0.77 mg/dL (ref 0.50–0.99)
GLUCOSE: 116 mg/dL — AB (ref 65–99)
POTASSIUM: 3.9 mmol/L (ref 3.5–5.3)
Sodium: 139 mmol/L (ref 135–146)

## 2016-06-22 LAB — LIPID PANEL
CHOLESTEROL: 221 mg/dL — AB (ref <200)
HDL: 68 mg/dL (ref >50)
LDL Cholesterol: 128 mg/dL — ABNORMAL HIGH (ref <100)
Total CHOL/HDL Ratio: 3.3 Ratio (ref <5.0)
Triglycerides: 123 mg/dL (ref <150)
VLDL: 25 mg/dL (ref <30)

## 2016-06-23 ENCOUNTER — Telehealth: Payer: Self-pay | Admitting: Family

## 2016-06-23 ENCOUNTER — Encounter: Payer: Self-pay | Admitting: Family

## 2016-06-23 NOTE — Telephone Encounter (Signed)
Results

## 2016-06-25 ENCOUNTER — Ambulatory Visit: Payer: BLUE CROSS/BLUE SHIELD | Admitting: Family

## 2016-07-02 ENCOUNTER — Ambulatory Visit (INDEPENDENT_AMBULATORY_CARE_PROVIDER_SITE_OTHER): Payer: BLUE CROSS/BLUE SHIELD | Admitting: Endocrinology

## 2016-07-02 ENCOUNTER — Encounter: Payer: Self-pay | Admitting: Endocrinology

## 2016-07-02 VITALS — BP 152/94 | HR 85 | Ht 63.5 in | Wt 146.0 lb

## 2016-07-02 DIAGNOSIS — E1165 Type 2 diabetes mellitus with hyperglycemia: Secondary | ICD-10-CM | POA: Diagnosis not present

## 2016-07-02 DIAGNOSIS — E113393 Type 2 diabetes mellitus with moderate nonproliferative diabetic retinopathy without macular edema, bilateral: Secondary | ICD-10-CM | POA: Diagnosis not present

## 2016-07-02 DIAGNOSIS — E042 Nontoxic multinodular goiter: Secondary | ICD-10-CM

## 2016-07-02 MED ORDER — METFORMIN HCL ER 500 MG PO TB24: 500.0000 mg | ORAL_TABLET | Freq: Every day | ORAL | 3 refills | Status: DC

## 2016-07-02 NOTE — Patient Instructions (Addendum)
good diet and exercise significantly improve the control of your diabetes.  please let me know if you wish to be referred to a dietician.  high blood sugar is very risky to your health.  you should see an eye doctor and dentist every year.  It is very important to get all recommended vaccinations.  Controlling your blood pressure and cholesterol drastically reduces the damage diabetes does to your body.  Those who smoke should quit.  Please discuss these with your doctor.  check your blood sugar once a day.  vary the time of day when you check, between before the 3 meals, and at bedtime.  also check if you have symptoms of your blood sugar being too high or too low.  please keep a record of the readings and bring it to your next appointment here (or you can bring the meter itself).  You can write it on any piece of paper.  please call us sooner if your blood sugar goes below 70, or if you have a lot of readings over 200.  I have sent a prescription to your pharmacy, to add metformin.   We will need to take this complex situation in stages.   let's check a thyroid "scan" (a special, but easy and painless type of thyroid x ray).  you will receive a phone call, about a day and time for an appointment.   Please come back for a follow-up appointment in 2-3 weeks.

## 2016-07-02 NOTE — Progress Notes (Signed)
Subjective:    Patient ID: Ellen Morris, female    DOB: 02/03/1951, 66 y.o.   MRN: [id number]  HPI pt is referred by Sandford CrazeMelissa O'Sullivan, NP, for diabetes.  Pt states DM was dx'ed in 2014; she has mild neuropathy of the lower extremities; she has associated retinopathy, CAD, nephropathy, and foot ulcer; she took insulin for just a brief time after dx; pt says her diet and exercise are improved; she has never had GDM, pancreatitis, severe hypoglycemia or DKA.  She wants to go slow with adding meds. Pt is also referred by for nodular thyroid.  Pt was incidentally noted to have a goiter in early 2018, on an x-ray.  she is unaware of ever having had thyroid problems in the past.  she has no h/o XRT or surgery to the neck.   Past Medical History:  Diagnosis Date  . CAD (coronary artery disease) 03/05/2016  . Diabetes mellitus without complication (HCC)   . Hypertension     Past Surgical History:  Procedure Laterality Date  . CORONARY ANGIOPLASTY WITH STENT PLACEMENT      Social History   Social History  . Marital status: Single    Spouse name: N/A  . Number of children: N/A  . Years of education: N/A   Occupational History  . Not on file.   Social History Main Topics  . Smoking status: Never Smoker  . Smokeless tobacco: Never Used  . Alcohol use Yes     Comment: occasional wine  . Drug use: No  . Sexual activity: Not on file   Other Topics Concern  . 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     Current Outpatient Prescriptions on File Prior to Visit  Medication Sig Dispense Refill  . aspirin EC 81 MG tablet Take 81 mg by mouth daily.    Marland Kitchen. glimepiride (AMARYL) 4 MG tablet Take 4 mg by mouth daily with breakfast.    . losartan (COZAAR) 50 MG tablet Take 1 tablet (50 mg total) by mouth daily. 30 tablet 0  . ONE TOUCH ULTRA TEST  test strip Check blood sugar 2 times daily.    Letta Pate. ONETOUCH DELICA LANCETS 33G MISC Use to check blood sugar twice a day.    . sitaGLIPtin (JANUVIA) 100 MG tablet Take 1 tablet (100 mg total) by mouth daily. 30 tablet 5   No current facility-administered medications on file prior to visit.     Allergies  Allergen Reactions  . Metformin Diarrhea  . Atorvastatin     Other reaction(s): Other (See Comments) Severe fatigue and bones hurt  . Lisinopril     "made me feel funny"    Family History  Problem Relation Age of Onset  . Diabetes Mother   . Hypertension Mother   . Heart disease Father   . Goiter Paternal Grandmother     BP (!) 152/94   Pulse 85   Ht 5' 3.5" (1.613 m)   Wt 146 lb (66.2 kg)   SpO2 98%   BMI 25.46 kg/m   Review of Systems denies weight loss, headache, chest pain, sob, n/v, urinary frequency, muscle cramps, excessive diaphoresis, depression, cold intolerance, rhinorrhea, and easy bruising.  She has chronically blurry vision.     Objective:   Physical Exam VS: see vs  page GEN: no distress HEAD: head: no deformity eyes: no periorbital swelling, no proptosis external nose and ears are normal mouth: no lesion seen NECK: supple, thyroid is not enlarged CHEST WALL: no deformity LUNGS: clear to auscultation CV: reg rate and rhythm, no murmur ABD: abdomen is soft, nontender.  no hepatosplenomegaly.  not distended.  no hernia MUSCULOSKELETAL: muscle bulk and strength are grossly normal.  no obvious joint swelling.  gait is normal and steady EXTEMITIES: no deformity.  no ulcer on the feet.  feet are of normal color and temp.  no edema PULSES: dorsalis pedis intact bilat.  no carotid bruit NEURO:  cn 2-12 grossly intact.   readily moves all 4's.  sensation is intact to touch on the feet SKIN:  Normal texture and temperature.  No rash or suspicious lesion is visible.   NODES:  None palpable at the neck PSYCH: alert, well-oriented.  Does not appear anxious nor  depressed.  Lab Results  Component Value Date   HGBA1C 9.1 (H) 06/07/2016   Lab Results  Component Value Date   TSH 0.45 06/07/2016   Lab Results  Component Value Date   CREATININE 0.77 06/21/2016   BUN 30 (H) 06/21/2016   NA 139 06/21/2016   K 3.9 06/21/2016   CL 103 06/21/2016   CO2 26 06/21/2016   I personally reviewed electrocardiogram tracing (02/13/16): Indication: severe hyperglycemia.  Impression: NSR.  No MI.  No hypertrophy.  Compared to earlier same day: no change.     Assessment & Plan:  Type 2 DM, with CAD: she needs increased rx Multinodular goiter, new to me.  TSH is borderline low, so scan is next step Diarrhea: we'll have to start with low-dose metformin.  HTN: we'll recheck next time.   Patient is advised the following: Patient Instructions  good diet and exercise significantly improve the control of your diabetes.  please let me know if you wish to be referred to a dietician.  high blood sugar is very risky to your health.  you should see an eye doctor and dentist every year.  It is very important to get all recommended vaccinations.  Controlling your blood pressure and cholesterol drastically reduces the damage diabetes does to your body.  Those who smoke should quit.  Please discuss these with your doctor.  check your blood sugar once a day.  vary the time of day when you check, between before the 3 meals, and at bedtime.  also check if you have symptoms of your blood sugar being too high or too low.  please keep a record of the readings and bring it to your next appointment here (or you can bring the meter itself).  You can write it on any piece of paper.  please call us sooner if your blood sugar goes below 70, or if you have a lot of readings over 200.  I have sent a prescription to your pharmacy, to add metformin.   We will need to take this complex situation in stages.   let's check a thyroid "scan" (a special, but easy and painless type of thyroid x ray).   you will receive a phone call, about a day and time for an appointment.   Please come back for a follow-up appointment in 2-3 weeks.

## 2016-07-22 NOTE — Progress Notes (Signed)
HPI: 66 year old female for evaluation of coronary artery disease. Patient had drug-eluting stent to second marginal in 2008. Patient denies dyspnea, chest pain, palpitations or syncope. No claudication.   Current Outpatient Prescriptions  Medication Sig Dispense Refill  . aspirin EC 81 MG tablet Take 81 mg by mouth daily.    Marland Kitchen glimepiride (AMARYL) 4 MG tablet Take 4 mg by mouth daily with breakfast.    . losartan (COZAAR) 50 MG tablet Take 1 tablet (50 mg total) by mouth daily. 30 tablet 0  . ONE TOUCH ULTRA TEST test strip Check blood sugar 2 times daily.    Letta Pate DELICA LANCETS 33G MISC Use to check blood sugar twice a day.    . sitaGLIPtin (JANUVIA) 100 MG tablet Take 1 tablet (100 mg total) by mouth daily. (Patient not taking: Reported on 07/25/2016) 30 tablet 5   No current facility-administered medications for this visit.     Allergies  Allergen Reactions  . Metformin Diarrhea  . Atorvastatin     Other reaction(s): Other (See Comments) Severe fatigue and bones hurt  . Lisinopril     "made me feel funny"     Past Medical History:  Diagnosis Date  . CAD (coronary artery disease) 03/05/2016  . Diabetes mellitus without complication (HCC)   . Goiter   . Hypertension     Past Surgical History:  Procedure Laterality Date  . CORONARY ANGIOPLASTY WITH STENT PLACEMENT      Social History   Social History  . Marital status: Divorced    Spouse name: N/A  . Number of children: 4  . Years of education: N/A   Occupational History  . Not on file.   Social History Main Topics  . Smoking status: Never Smoker  . Smokeless tobacco: Never Used  . Alcohol use Yes     Comment: occasional wine  . Drug use: No  . Sexual activity: Not on file   Other Topics Concern  . 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     Family  History  Problem Relation Age of Onset  . Diabetes Mother   . Hypertension Mother   . Heart disease Father   . Goiter Paternal Grandmother     ROS: no fevers or chills, productive cough, hemoptysis, dysphasia, odynophagia, melena, hematochezia, dysuria, hematuria, rash, seizure activity, orthopnea, PND, pedal edema, claudication. Remaining systems are negative.  Physical Exam:   Blood pressure (!) 162/80, height 5\' 2"  (1.575 m), weight 146 lb (66.2 kg).  General:  Well developed/well nourished in NAD Skin warm/dry Patient not depressed No peripheral clubbing Back-normal HEENT-normal/normal eyelids Neck supple/normal carotid upstroke bilaterally; no bruits; no JVD; no thyromegaly chest - CTA/ normal expansion CV - RRR/normal S1 and S2; no rubs or gallops;  PMI nondisplaced; no murmurs.  Abdomen -NT/ND, no HSM, no mass, + bowel sounds, no bruit 2+ femoral pulses, no bruits Ext-no edema, chords, 2+ DP Neuro-grossly nonfocal  ECG 02/03/2016 normal sinus rhythm with no ST changes. personally reviewed  A/P  1 CAD-Continue ASA and add statin. Schedule stress echocardiogram for risk stratification.   2 Hypertension-BP elevated. Increase Cozaar to 100 mg daily. Follow blood pressure at home and adjust regimen as needed. Check potassium and renal function in 1 week.   3 Hyperlipidemia-Given history  of coronary artery disease add Crestor. She did not tolerate Lipitor in the past and I will therefore start at a moderate dose. Begin 20 mg daily. Check lipids and liver in 4 weeks.   Olga MillersBrian Lonita Debes, MD

## 2016-07-23 ENCOUNTER — Ambulatory Visit (INDEPENDENT_AMBULATORY_CARE_PROVIDER_SITE_OTHER): Payer: BLUE CROSS/BLUE SHIELD | Admitting: Endocrinology

## 2016-07-23 ENCOUNTER — Encounter: Payer: Self-pay | Admitting: Endocrinology

## 2016-07-23 VITALS — BP 142/84 | HR 94 | Ht 63.5 in | Wt 146.0 lb

## 2016-07-23 DIAGNOSIS — E113393 Type 2 diabetes mellitus with moderate nonproliferative diabetic retinopathy without macular edema, bilateral: Secondary | ICD-10-CM

## 2016-07-23 DIAGNOSIS — E1165 Type 2 diabetes mellitus with hyperglycemia: Secondary | ICD-10-CM

## 2016-07-23 NOTE — Progress Notes (Signed)
Subjective:    Patient ID: Ellen Morris, female    DOB: 1950/11/07, 66 y.o.   MRN: [id number]  HPI Pt returns for f/u of diabetes mellitus: DM type: 2 Dx'ed: 2014 Complications: polyneuropathy, retinopathy, CAD, nephropathy, and foot ulcer.   Therapy: 3 oral meds.  GDM: never.  DKA: never. Severe hypoglycemia: never.   Pancreatitis: never.   Other: she took insulin for just a brief time after dx.  Interval history: She says cbg's vary from 93-100's.  pt states she feels well in general. It is in general higher as the day goes on.  She stopped metformin, due to diarrhea. Pt also has nodular thyroid.  Pt was incidentally noted to have a goiter in early 2018, on an x-ray.  She is awaiting scan appt.  Past Medical History:  Diagnosis Date  . CAD (coronary artery disease) 03/05/2016  . Diabetes mellitus without complication (HCC)   . Hypertension     Past Surgical History:  Procedure Laterality Date  . CORONARY ANGIOPLASTY WITH STENT PLACEMENT      Social History   Social History  . Marital status: Single    Spouse name: N/A  . Number of children: N/A  . Years of education: N/A   Occupational History  . Not on file.   Social History Main Topics  . Smoking status: Never Smoker  . Smokeless tobacco: Never Used  . Alcohol use Yes     Comment: occasional wine  . Drug use: No  . Sexual activity: Not on file   Other Topics Concern  . Not on file   Social History Narrative   Patient has 4 children   Daughter in 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     Current Outpatient Prescriptions on File Prior to Visit  Medication Sig Dispense Refill  . aspirin EC 81 MG tablet Take 81 mg by mouth daily.    Marland Kitchen. glimepiride (AMARYL) 4 MG tablet Take 4 mg by mouth daily with breakfast.    . losartan (COZAAR) 50 MG tablet Take 1 tablet (50 mg total) by mouth daily. 30 tablet 0  . ONE TOUCH ULTRA TEST test strip  Check blood sugar 2 times daily.    Letta Pate. ONETOUCH DELICA LANCETS 33G MISC Use to check blood sugar twice a day.    . sitaGLIPtin (JANUVIA) 100 MG tablet Take 1 tablet (100 mg total) by mouth daily. 30 tablet 5   No current facility-administered medications on file prior to visit.     Allergies  Allergen Reactions  . Metformin Diarrhea  . Atorvastatin     Other reaction(s): Other (See Comments) Severe fatigue and bones hurt  . Lisinopril     "made me feel funny"    Family History  Problem Relation Age of Onset  . Diabetes Mother   . Hypertension Mother   . Heart disease Father   . Goiter Paternal Grandmother     BP (!) 142/84   Pulse 94   Ht 5' 3.5" (1.613 m)   Wt 146 lb (66.2 kg)   SpO2 98%   BMI 25.46 kg/m     Review of Systems She denies hypoglycemia    Objective:   Physical Exam VITAL SIGNS:  See vs page GENERAL: no distress Pulses: dorsalis pedis intact bilat.   MSK: no deformity of the feet CV: no leg edema Skin:  no ulcer on the feet.  normal color and temp on the feet. Neuro: sensation is intact to touch on the feet     Assessment & Plan:  Type 2 DM, with CAD: uncertain control.  Check fructosamine Multinodular goiter, new to me.  TSH is borderline low, so scan is next step.  Patient is advised the following: Patient Instructions  check your blood sugar once a day.  vary the time of day when you check, between before the 3 meals, and at bedtime.  also check if you have symptoms of your blood sugar being too high or too low.  please keep a record of the readings and bring it to your next appointment here (or you can bring the meter itself).  You can write it on any piece of paper.  please call us sooner if your blood sugar goes below 70, or if you have a lot of readings over 200.  blood tests are requested for you today.  We'll let you know about the results.  Please come back for a follow-up appointment in 2-3 months.

## 2016-07-23 NOTE — Patient Instructions (Addendum)
check your blood sugar once a day.  vary the time of day when you check, between before the 3 meals, and at bedtime.  also check if you have symptoms of your blood sugar being too high or too low.  please keep a record of the readings and bring it to your next appointment here (or you can bring the meter itself).  You can write it on any piece of paper.  please call us sooner if your blood sugar goes below 70, or if you have a lot of readings over 200.  blood tests are requested for you today.  We'll let you know about the results.  Please come back for a follow-up appointment in 2-3 months.

## 2016-07-25 ENCOUNTER — Other Ambulatory Visit: Payer: Self-pay | Admitting: Endocrinology

## 2016-07-25 ENCOUNTER — Ambulatory Visit: Payer: BLUE CROSS/BLUE SHIELD | Admitting: Cardiology

## 2016-07-25 ENCOUNTER — Encounter: Payer: Self-pay | Admitting: Cardiology

## 2016-07-25 ENCOUNTER — Ambulatory Visit (INDEPENDENT_AMBULATORY_CARE_PROVIDER_SITE_OTHER): Payer: BLUE CROSS/BLUE SHIELD | Admitting: Cardiology

## 2016-07-25 VITALS — BP 162/80 | Ht 62.0 in | Wt 146.0 lb

## 2016-07-25 DIAGNOSIS — I2510 Atherosclerotic heart disease of native coronary artery without angina pectoris: Secondary | ICD-10-CM | POA: Diagnosis not present

## 2016-07-25 DIAGNOSIS — I10 Essential (primary) hypertension: Secondary | ICD-10-CM

## 2016-07-25 DIAGNOSIS — E7800 Pure hypercholesterolemia, unspecified: Secondary | ICD-10-CM

## 2016-07-25 LAB — FRUCTOSAMINE: Fructosamine: 302 umol/L — ABNORMAL HIGH (ref 190–270)

## 2016-07-25 MED ORDER — GLIMEPIRIDE 2 MG PO TABS: 2.0000 mg | ORAL_TABLET | Freq: Every day | ORAL | 11 refills | Status: DC

## 2016-07-25 MED ORDER — LOSARTAN POTASSIUM 100 MG PO TABS: 100.0000 mg | ORAL_TABLET | Freq: Every day | ORAL | 3 refills | Status: DC

## 2016-07-25 MED ORDER — ROSUVASTATIN CALCIUM 20 MG PO TABS: 20.0000 mg | ORAL_TABLET | Freq: Every day | ORAL | 3 refills | Status: DC

## 2016-07-25 NOTE — Patient Instructions (Signed)
Medication Instructions:   INCREASE LOSARTAN TO 100 MG ONCE DAILY= 2 OF THE 50 MG TABLETS ONCE DAILY  START ROSUVASTATIN 20 MG ONCE DAILY  Labwork:  Your physician recommends that you return for lab work in: ONE WEEK  Your physician recommends that you return for lab work in: 4 WEEKS = DO NOT EAT PRIOR TO LAB WORK  Testing/Procedures:  Your physician has requested that you have a stress echocardiogram. For further information please visit [email or web address]. Please follow instruction sheet as given.    Follow-Up:  Your physician recommends that you schedule a follow-up appointment in: 3 MONTHS WITH DR CRENSHAW    Low-Sodium Eating Plan Sodium, which is an element that makes up salt, helps you maintain a healthy balance of fluids in your body. Too much sodium can increase your blood pressure and cause fluid and waste to be held in your body. Your health care provider or dietitian may recommend following this plan if you have high blood pressure (hypertension), kidney disease, liver disease, or heart failure. Eating less sodium can help lower your blood pressure, reduce swelling, and protect your heart, liver, and kidneys. What are tips for following this plan? General guidelines   Most people on this plan should limit their sodium intake to 1,500-2,000 mg (milligrams) of sodium each day. Reading food labels   The Nutrition Facts label lists the amount of sodium in one serving of the food. If you eat more than one serving, you must multiply the listed amount of sodium by the number of servings.  Choose foods with less than 140 mg of sodium per serving.  Avoid foods with 300 mg of sodium or more per serving. Shopping   Look for lower-sodium products, often labeled as "low-sodium" or "no salt added."  Always check the sodium content even if foods are labeled as "unsalted" or "no salt added".  Buy fresh foods.  Avoid canned foods and premade or frozen meals.  Avoid canned,  cured, or processed meats  Buy breads that have less than 80 mg of sodium per slice. Cooking   Eat more home-cooked food and less restaurant, buffet, and fast food.  Avoid adding salt when cooking. Use salt-free seasonings or herbs instead of table salt or sea salt. Check with your health care provider or pharmacist before using salt substitutes.  Cook with plant-based oils, such as canola, sunflower, or olive oil. Meal planning   When eating at a restaurant, ask that your food be prepared with less salt or no salt, if possible.  Avoid foods that contain MSG (monosodium glutamate). MSG is sometimes added to Cong[place]inese food, bouillon, and some canned foods. What foods are recommended? The items listed may not be a complete list. Talk with your dietitian about what dietary choices are best for you. Grains  Low-sodium cereals, including oats, puffed wheat and rice, and shredded wheat. Low-sodium crackers. Unsalted rice. Unsalted pasta. Low-sodium bread. Whole-grain breads and whole-grain pasta. Vegetables  Fresh or frozen vegetables. "No salt added" canned vegetables. "No salt added" tomato sauce and paste. Low-sodium or reduced-sodium tomato and vegetable juice. Fruits  Fresh, frozen, or canned fruit. Fruit juice. Meats and other protein foods  Fresh or frozen (no salt added) meat, poultry, seafood, and fish. Low-sodium canned tuna and salmon. Unsalted nuts. Dried peas, beans, and lentils without added salt. Unsalted canned beans. Eggs. Unsalted nut butters. Dairy  Milk. Soy milk. Cheese that is naturally low in sodium, such as ricotta cheese, fresh mozzarella, or Swiss cheese Low-sodium or  reduced-sodium cheese. Cream cheese. Yogurt. Fats and oils  Unsalted butter. Unsalted margarine with no trans fat. Vegetable oils such as canola or olive oils. Seasonings and other foods  Fresh and dried herbs and spices. Salt-free seasonings. Low-sodium mustard and ketchup. Sodium-free salad dressing.  Sodium-free light mayonnaise. Fresh or refrigerated horseradish. Lemon juice. Vinegar. Homemade, reduced-sodium, or low-sodium soups. Unsalted popcorn and pretzels. Low-salt or salt-free chips. What foods are not recommended? The items listed may not be a complete list. Talk with your dietitian about what dietary choices are best for you. Grains  Instant hot cereals. Bread stuffing, pancake, and biscuit mixes. Croutons. Seasoned rice or pasta mixes. Noodle soup cups. Boxed or frozen macaroni and cheese. Regular salted crackers. Self-rising flour. Vegetables  Sauerkraut, pickled vegetables, and relishes. Olives. [place] fries. Onion rings. Regular canned vegetables (not low-sodium or reduced-sodium). Regular canned tomato sauce and paste (not low-sodium or reduced-sodium). Regular tomato and vegetable juice (not low-sodium or reduced-sodium). Frozen vegetables in sauces. Meats and other protein foods  Meat or fish that is salted, canned, smoked, spiced, or pickled. Bacon, ham, sausage, hotdogs, corned beef, chipped beef, packaged lunch meats, salt pork, jerky, pickled herring, anchovies, regular canned tuna, sardines, salted nuts. Dairy  Processed cheese and cheese spreads. Cheese curds. Blue cheese. Feta cheese. String cheese. Regular cottage cheese. Buttermilk. Canned milk. Fats and oils  Salted butter. Regular margarine. Ghee. Bacon fat. Seasonings and other foods  Onion salt, garlic salt, seasoned salt, table salt, and sea salt. Canned and packaged gravies. Worcestershire sauce. Tartar sauce. Barbecue sauce. Teriyaki sauce. Soy sauce, including reduced-sodium. Steak sauce. Fish sauce. Oyster sauce. Cocktail sauce. Horseradish that you find on the shelf. Regular ketchup and mustard. Meat flavorings and tenderizers. Bouillon cubes. Hot sauce and Tabasco sauce. Premade or packaged marinades. Premade or packaged taco seasonings. Relishes. Regular salad dressings. Salsa. Potato and tortilla chips. Corn  chips and puffs. Salted popcorn and pretzels. Canned or dried soups. Pizza. Frozen entrees and pot pies. Summary  Eating less sodium can help lower your blood pressure, reduce swelling, and protect your heart, liver, and kidneys.  Most people on this plan should limit their sodium intake to 1,500-2,000 mg (milligrams) of sodium each day.  Canned, boxed, and frozen foods are high in sodium. Restaurant foods, fast foods, and pizza are also very high in sodium. You also get sodium by adding salt to food.  Try to cook at home, eat more fresh fruits and vegetables, and eat less fast food, canned, processed, or prepared foods. This information is not intended to replace advice given to you by your health care provider. Make sure you discuss any questions you have with your health care provider. Document Released: 11/02/2001 Document Revised: 05/06/2016 Document Reviewed: 05/06/2016 Elsevier Interactive Patient Education  2017 [employer].

## 2016-07-26 ENCOUNTER — Ambulatory Visit: Payer: BLUE CROSS/BLUE SHIELD | Admitting: Family

## 2016-08-03 LAB — BASIC METABOLIC PANEL
BUN: 19 mg/dL (ref 7–25)
CO2: 29 mmol/L (ref 20–31)
Calcium: 9.5 mg/dL (ref 8.6–10.4)
Chloride: 103 mmol/L (ref 98–110)
Creat: 0.61 mg/dL (ref 0.50–0.99)
Glucose, Bld: 75 mg/dL (ref 65–99)
POTASSIUM: 4.1 mmol/L (ref 3.5–5.3)
SODIUM: 139 mmol/L (ref 135–146)

## 2016-08-12 ENCOUNTER — Encounter (HOSPITAL_COMMUNITY)
Admission: RE | Admit: 2016-08-12 | Discharge: 2016-08-12 | Disposition: A | Discharge status: Home / Self Care | Payer: BLUE CROSS/BLUE SHIELD | Source: Ambulatory Visit | Attending: Endocrinology | Admitting: Endocrinology

## 2016-08-12 DIAGNOSIS — E042 Nontoxic multinodular goiter: Secondary | ICD-10-CM | POA: Insufficient documentation

## 2016-08-13 ENCOUNTER — Encounter (HOSPITAL_COMMUNITY)
Admission: RE | Admit: 2016-08-13 | Discharge: 2016-08-13 | Disposition: A | Discharge status: Home / Self Care | Payer: BLUE CROSS/BLUE SHIELD | Source: Ambulatory Visit | Attending: Endocrinology | Admitting: Endocrinology

## 2016-08-13 DIAGNOSIS — E042 Nontoxic multinodular goiter: Secondary | ICD-10-CM | POA: Diagnosis not present

## 2016-08-13 MED ORDER — SODIUM IODIDE I 131 CAPSULE
16.8000 | Freq: Once | INTRAVENOUS | Status: AC | PRN
  Administered 2016-08-12: 16.8 via ORAL

## 2016-08-13 MED ORDER — SODIUM PERTECHNETATE TC 99M INJECTION
10.9300 | Freq: Once | INTRAVENOUS | Status: AC | PRN
  Administered 2016-08-13: 10.93 via INTRAVENOUS

## 2016-08-14 ENCOUNTER — Other Ambulatory Visit: Payer: Self-pay | Admitting: Endocrinology

## 2016-08-14 DIAGNOSIS — E042 Nontoxic multinodular goiter: Secondary | ICD-10-CM

## 2016-08-15 ENCOUNTER — Telehealth (HOSPITAL_COMMUNITY): Payer: Self-pay | Admitting: *Deleted

## 2016-08-15 NOTE — Telephone Encounter (Signed)
Patient given detailed instructions per Stress Test Requisition Sheet for test on 08/20/16 at 2:30.Patient Notified to arrive 30 minutes early, and that it is imperative to arrive on time for appointment to keep from having the test rescheduled.  Patient verbalized understanding. Daneil DolinSharon S Brooks

## 2016-08-18 MED ORDER — IOPAMIDOL (ISOVUE-370) INJECTION 76%
INTRAVENOUS | Status: AC
  Filled 2016-08-18: qty 100

## 2016-08-20 ENCOUNTER — Other Ambulatory Visit (HOSPITAL_COMMUNITY): Payer: BLUE CROSS/BLUE SHIELD

## 2016-08-20 ENCOUNTER — Ambulatory Visit (HOSPITAL_COMMUNITY): Payer: BLUE CROSS/BLUE SHIELD

## 2016-08-20 ENCOUNTER — Ambulatory Visit (HOSPITAL_COMMUNITY): Payer: BLUE CROSS/BLUE SHIELD | Attending: Cardiology

## 2016-08-20 DIAGNOSIS — I2510 Atherosclerotic heart disease of native coronary artery without angina pectoris: Secondary | ICD-10-CM | POA: Diagnosis present

## 2016-08-20 DIAGNOSIS — R0600 Dyspnea, unspecified: Secondary | ICD-10-CM | POA: Insufficient documentation

## 2016-08-21 ENCOUNTER — Ambulatory Visit
Admission: RE | Admit: 2016-08-21 | Discharge: 2016-08-21 | Disposition: A | Discharge status: Home / Self Care | Payer: BLUE CROSS/BLUE SHIELD | Source: Ambulatory Visit | Attending: Endocrinology | Admitting: Endocrinology

## 2016-08-21 ENCOUNTER — Other Ambulatory Visit (HOSPITAL_COMMUNITY)
Admission: RE | Admit: 2016-08-21 | Discharge: 2016-08-21 | Disposition: A | Discharge status: Home / Self Care | Payer: BLUE CROSS/BLUE SHIELD | Source: Ambulatory Visit | Attending: General Surgery | Admitting: General Surgery

## 2016-08-21 DIAGNOSIS — E042 Nontoxic multinodular goiter: Secondary | ICD-10-CM

## 2016-08-21 DIAGNOSIS — E061 Subacute thyroiditis: Secondary | ICD-10-CM | POA: Insufficient documentation

## 2016-08-21 DIAGNOSIS — E041 Nontoxic single thyroid nodule: Secondary | ICD-10-CM | POA: Insufficient documentation

## 2016-08-30 ENCOUNTER — Encounter: Payer: Self-pay | Admitting: *Deleted

## 2016-09-09 ENCOUNTER — Ambulatory Visit: Payer: BLUE CROSS/BLUE SHIELD | Admitting: Family

## 2016-09-16 LAB — LIPID PANEL
CHOL/HDL RATIO: 2.6 ratio (ref <5.0)
Cholesterol: 185 mg/dL (ref <200)
HDL: 70 mg/dL (ref >50)
LDL CALC: 88 mg/dL (ref <100)
Triglycerides: 135 mg/dL (ref <150)
VLDL: 27 mg/dL (ref <30)

## 2016-09-16 LAB — HEPATIC FUNCTION PANEL
ALBUMIN: 3.8 g/dL (ref 3.6–5.1)
ALT: 27 U/L (ref 6–29)
AST: 18 U/L (ref 10–35)
Alkaline Phosphatase: 94 U/L (ref 33–130)
BILIRUBIN DIRECT: 0.1 mg/dL (ref <=0.2)
Indirect Bilirubin: 0.3 mg/dL (ref 0.2–1.2)
Total Bilirubin: 0.4 mg/dL (ref 0.2–1.2)
Total Protein: 7.4 g/dL (ref 6.1–8.1)

## 2016-09-18 ENCOUNTER — Encounter: Payer: Self-pay | Admitting: *Deleted

## 2016-10-15 NOTE — Progress Notes (Signed)
HPI: FU coronary artery disease. Patient had drug-eluting stent to second marginal in 2008. Stress echocardiogram March 2018 showed no ischemia. Since last seen the patient denies any dyspnea on exertion, orthopnea, PND, pedal edema, palpitations, syncope or chest pain. Patient is not taking 100 mg of Cozaar daily because she states it was causing dizziness. However she states her blood pressure was between 130 to 150. She has not had syncope.   Current Outpatient Prescriptions  Medication Sig Dispense Refill  . aspirin EC 81 MG tablet Take 81 mg by mouth daily.    Marland Kitchen. glimepiride (AMARYL) 2 MG tablet Take 1 tablet (2 mg total) by mouth daily with breakfast. 30 tablet 11  . losartan (COZAAR) 100 MG tablet Take 1 tablet (100 mg total) by mouth daily. 90 tablet 3  . ONE TOUCH ULTRA TEST test strip Check blood sugar 2 times daily.    Letta Pate. ONETOUCH DELICA LANCETS 33G MISC Use to check blood sugar twice a day.    . rosuvastatin (CRESTOR) 20 MG tablet Take 1 tablet (20 mg total) by mouth daily. 90 tablet 3  . sitaGLIPtin (JANUVIA) 100 MG tablet Take 1 tablet (100 mg total) by mouth daily. 30 tablet 5   No current facility-administered medications for this visit.      Past Medical History:  Diagnosis Date  . CAD (coronary artery disease) 03/05/2016  . Diabetes mellitus without complication (HCC)   . Goiter   . Hypertension     Past Surgical History:  Procedure Laterality Date  . CORONARY ANGIOPLASTY WITH STENT PLACEMENT      Social History   Social History  . Marital status: Divorced    Spouse name: N/A  . Number of children: 4  . Years of education: N/A   Occupational History  . Not on file.   Social History Main Topics  . Smoking status: Never Smoker  . Smokeless tobacco: Never Used  . Alcohol use Yes     Comment: occasional wine  . Drug use: No  . Sexual activity: Not on file   Other Topics Concern  . 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     Family History  Problem Relation Age of Onset  . Diabetes Mother   . Hypertension Mother   . Heart disease Father   . Goiter Paternal Grandmother     ROS: no fevers or chills, productive cough, hemoptysis, dysphasia, odynophagia, melena, hematochezia, dysuria, hematuria, rash, seizure activity, orthopnea, PND, pedal edema, claudication. Remaining systems are negative.  Physical Exam: Well-developed well-nourished in no acute distress.  Skin is warm and dry.  HEENT is normal.  Neck is supple. No bruits  Chest is clear to auscultation with normal expansion.  Cardiovascular exam is regular rate and rhythm. No murmurs  Abdominal exam nontender or distended. No masses palpated. Extremities show no edema. neuro grossly intact  ECG- sinus rhythm at a rate of 85. No ST changes. personally reviewed  A/P  1 coronary artery disease-recent stress echocardiogram normal. Plan medical therapy. Continue aspirin and statin.  2 hypertension-blood pressure is mildly elevated. She is only taking 50 mg of Cozaar daily. She will take 50 mg twice a day to see if this improves her dizziness and controls her BP.  3 hyperlipidemia-continue statin.  Kirk Ruths, MD

## 2016-10-18 ENCOUNTER — Encounter: Payer: Self-pay | Admitting: Cardiology

## 2016-10-18 ENCOUNTER — Ambulatory Visit (INDEPENDENT_AMBULATORY_CARE_PROVIDER_SITE_OTHER): Payer: BLUE CROSS/BLUE SHIELD | Admitting: Cardiology

## 2016-10-18 VITALS — BP 130/88 | HR 85 | Ht 63.0 in | Wt 146.0 lb

## 2016-10-18 DIAGNOSIS — I2510 Atherosclerotic heart disease of native coronary artery without angina pectoris: Secondary | ICD-10-CM | POA: Diagnosis not present

## 2016-10-18 DIAGNOSIS — I10 Essential (primary) hypertension: Secondary | ICD-10-CM

## 2016-10-18 DIAGNOSIS — E7800 Pure hypercholesterolemia, unspecified: Secondary | ICD-10-CM | POA: Diagnosis not present

## 2016-10-18 MED ORDER — LOSARTAN POTASSIUM 50 MG PO TABS: 50.0000 mg | ORAL_TABLET | Freq: Two times a day (BID) | ORAL | 3 refills | Status: AC

## 2016-10-18 NOTE — Patient Instructions (Signed)
Medication Instructions:   INCREASE LOSARTAN TO 50 MG TWICE DAILY= 1/2 OF THE 100 MG TABLET TWICE DAILY  Follow-Up:  Your physician wants you to follow-up in: ONE YEAR WITH DR Shelda PalRENSHAW You will receive a reminder letter in the mail two months in advance. If you don't receive a letter, please call our office to schedule the follow-up appointment.   If you need a refill on your cardiac medications before your next appointment, please call your pharmacy.

## 2016-10-23 ENCOUNTER — Ambulatory Visit: Payer: BLUE CROSS/BLUE SHIELD | Admitting: Endocrinology

## 2016-11-19 ENCOUNTER — Ambulatory Visit: Payer: BLUE CROSS/BLUE SHIELD | Admitting: Endocrinology

## 2017-07-11 ENCOUNTER — Other Ambulatory Visit: Payer: Self-pay | Admitting: Family

## 2017-09-16 ENCOUNTER — Other Ambulatory Visit: Payer: Self-pay | Admitting: Endocrinology

## 2017-09-16 ENCOUNTER — Other Ambulatory Visit: Payer: Self-pay | Admitting: Cardiology

## 2017-09-16 NOTE — Telephone Encounter (Signed)
Please refill x 1 Ov is due  

## 2018-02-10 ENCOUNTER — Other Ambulatory Visit: Payer: Self-pay | Admitting: Endocrinology

## 2018-02-11 IMAGING — US US THYROID
1 series · 2 of 2 positions shown · non-contrast
Comparison: None.

CLINICAL DATA: Incidental on other study. Right-sided goiter seen
on outside radiographs. Recent fall.

EXAM:
THYROID ULTRASOUND
TECHNIQUE: Ultrasound examination of the thyroid gland and adjacent soft
tissues was performed.

[Series 1: us thyroid · 2 acquisitions, 2 frames shown]
[im 1/2]
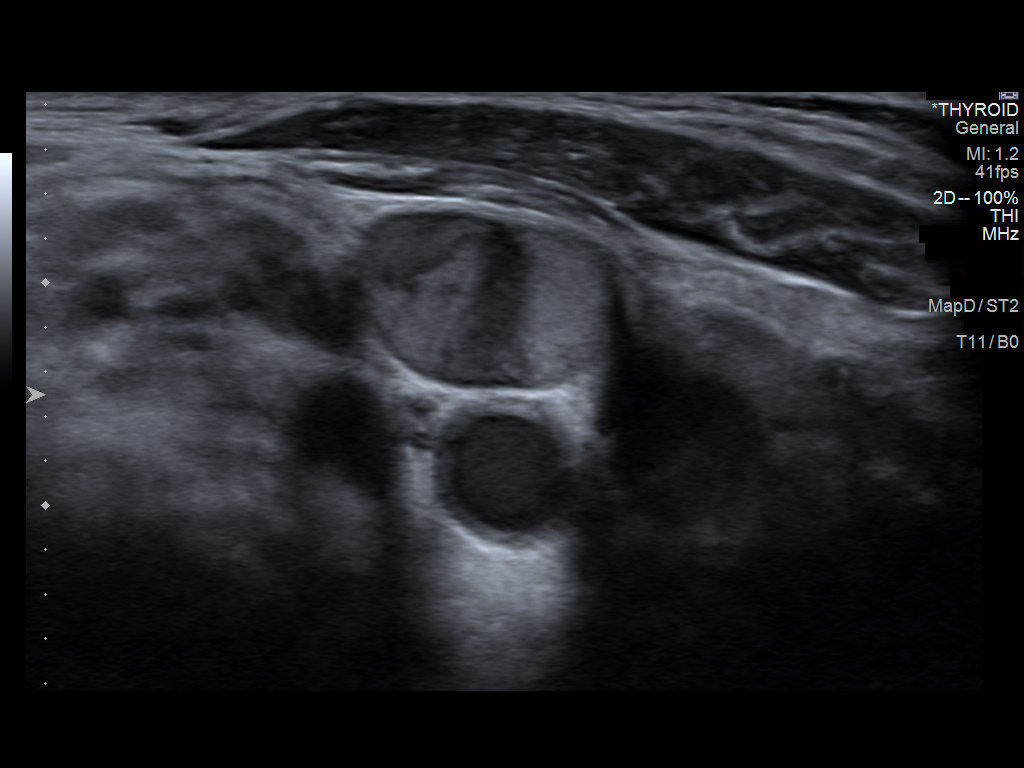
[im 2/2]
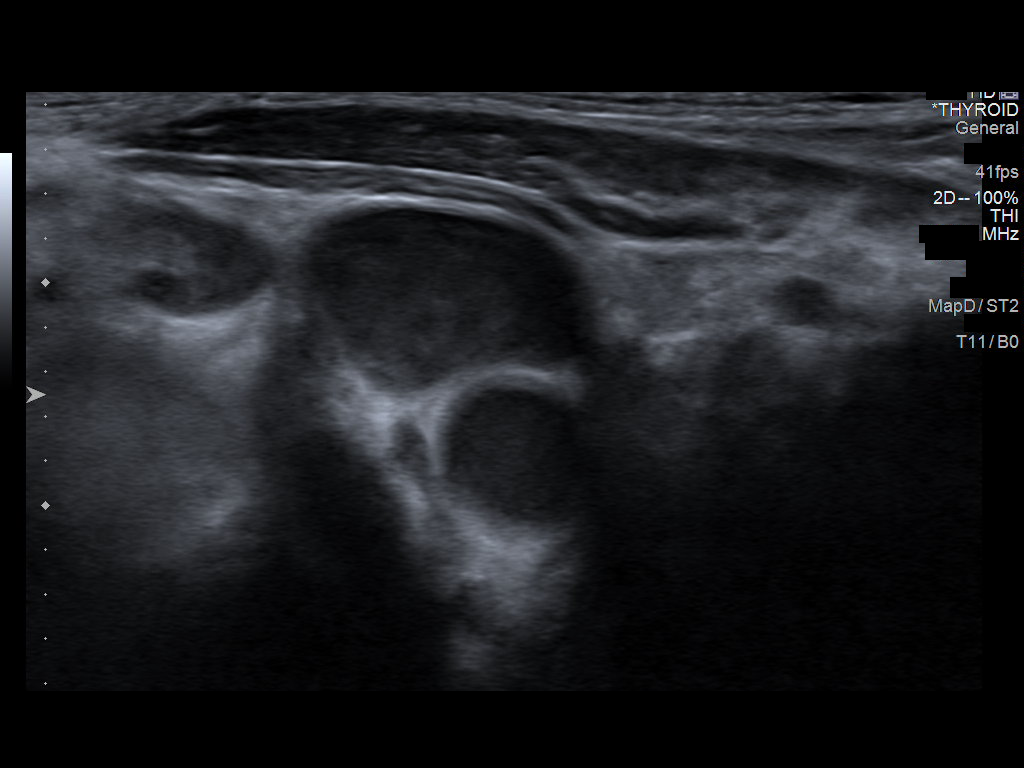

[2 of 2 positions shown; findings below may reference images not displayed]

FINDINGS: Parenchymal Echotexture: Markedly heterogenous

Isthmus: Enlarged measuring 0.9 cm in diameter

Right lobe: Enlarged measuring 9.2 x 3.3 x 3.8 cm

Left lobe: Enlarged measuring 8.8 x 3.3 x 3.0 cm

_________________________________________________________

Estimated total number of nodules >/= 1 cm: 4

Number of spongiform nodules >/=  2 cm not described below (TR1): 0

Number of mixed cystic and solid nodules >/= 1.5 cm not described
below (TR2): 0

Nodule # 1:

Location: Right; Superior

Maximum size: 2.9 cm; Other 2 dimensions: 2.7 x 1.2 cm cm

Composition: solid/almost completely solid (2)

Echogenicity: isoechoic (1)

Shape: not taller-than-wide (0)

Margins: smooth (0)

Echogenic foci: none (0)

ACR TI-RADS total points: 3.

ACR TI-RADS risk category: TR3 (3 points).

ACR TI-RADS recommendations:

**Given size (>/= 2.5 cm) and appearance, fine needle aspiration of
this mildly suspicious nodule should be considered based on TI-RADS
criteria.

Nodule # 2:

Location: Right; Mid

Maximum size: 3.6 cm; Other 2 dimensions: 2.1 x 2.5 cm

Composition: solid/almost completely solid (2)

Echogenicity: isoechoic (1)

Shape: not taller-than-wide (0)

Margins: ill-defined (0)

Echogenic foci: macrocalcifications (1)

ACR TI-RADS total points: 4.

ACR TI-RADS risk category: TR4 (4-6 points).

ACR TI-RADS recommendations:

**Given size (>/= 1.5 cm) and appearance, fine needle aspiration of
this moderately suspicious nodule should be considered based on
TI-RADS criteria.

Nodule # 3:

Location: Right; Inferior

Maximum size: 3.4 cm; Other 2 dimensions: 3.3 x 3.2 cm

Composition: solid/almost completely solid (2)

Echogenicity: isoechoic (1)

Shape: not taller-than-wide (0)

Margins: ill-defined (0)

Echogenic foci: none (0)

ACR TI-RADS total points: 3.

ACR TI-RADS risk category: TR3 (3 points).

ACR TI-RADS recommendations:

**Given size (>/= 2.5 cm) and appearance, fine needle aspiration of
this mildly suspicious nodule should be considered based on TI-RADS
criteria.

Nodule # 4:

Location: Left; Mid

Maximum size: 4.6 cm; Other 2 dimensions: 1.7 x 2.2 cm

Composition: solid/almost completely solid (2)

Echogenicity: hypoechoic (2)

Shape: not taller-than-wide (0)

Margins: ill-defined (0)

Echogenic foci: peripheral calcifications (2)

ACR TI-RADS total points: 6.

ACR TI-RADS risk category: TR4 (4-6 points).

ACR TI-RADS recommendations:

**Given size (>/= 1.5 cm) and appearance, fine needle aspiration of
this moderately suspicious nodule should be considered based on
TI-RADS criteria.

Rouleaux flow was demonstrated within the right internal jugular
vein which is noted to completely collapse on provided cine
compression images.
IMPRESSION: Findings compatible with multinodular goiter. Right-sided nodule #2
and left-sided nodule #4 are the most worrisome nodules based on
TI-RADS criteria and both meet imaging criteria to recommend
percutaneous sampling as clinically indicated.

The above is in keeping with the ACR TI-RADS recommendations - [HOSPITAL] 1804;[DATE].

## 2018-02-11 NOTE — Telephone Encounter (Signed)
This Rx was [facility]/C'ed/and pt must be seen for any fills from this office/thx dmf

## 2019-08-10 ENCOUNTER — Encounter: Payer: Self-pay | Admitting: Cardiology

## 2019-08-10 ENCOUNTER — Telehealth: Payer: Self-pay

## 2019-08-10 ENCOUNTER — Telehealth (INDEPENDENT_AMBULATORY_CARE_PROVIDER_SITE_OTHER): Payer: Medicare Other | Admitting: Cardiology

## 2019-08-10 VITALS — Ht 62.0 in | Wt 134.4 lb

## 2019-08-10 DIAGNOSIS — I2510 Atherosclerotic heart disease of native coronary artery without angina pectoris: Secondary | ICD-10-CM

## 2019-08-10 DIAGNOSIS — E119 Type 2 diabetes mellitus without complications: Secondary | ICD-10-CM

## 2019-08-10 DIAGNOSIS — I10 Essential (primary) hypertension: Secondary | ICD-10-CM

## 2019-08-10 NOTE — Telephone Encounter (Signed)
Called patient to discuss AVS instructions gave Luke Kilroy's recommendations and patient voiced understanding. AVS summary mailed to patient.    

## 2019-08-10 NOTE — Telephone Encounter (Signed)
Virtual Visit Pre-Appointment Phone Call  "(Name), I am calling you today to discuss your upcoming appointment. We are currently trying to limit exposure to the virus that causes COVID-19 by seeing patients at home rather than in the office."  1. "What is the BEST phone number to call the day of the visit?" - include this in appointment notes  2. "Do you have or have access to (through a family member/friend) a smartphone with video capability that we can use for your visit?" a. If yes - list this number in appt notes as "cell" (if different from BEST phone #) and list the appointment type as a VIDEO visit in appointment notes b. If no - list the appointment type as a PHONE visit in appointment notes  3. Confirm consent - "In the setting of the current Covid19 crisis, you are scheduled for a (phone or video) visit with your provider on (date) at (time).  Just as we do with many in-office visits, in order for you to participate in this visit, we must obtain consent.  If you'd like, I can send this to your mychart (if signed up) or email for you to review.  Otherwise, I can obtain your verbal consent now.  All virtual visits are billed to your insurance company just like a normal visit would be.  By agreeing to a virtual visit, we'd like you to understand that the technology does not allow for your provider to perform an examination, and thus may limit your provider's ability to fully assess your condition. If your provider identifies any concerns that need to be evaluated in person, we will make arrangements to do so.  Finally, though the technology is pretty good, we cannot assure that it will always work on either your or our end, and in the setting of a video visit, we may have to convert it to a phone-only visit.  In either situation, we cannot ensure that we have a secure connection.  Are you willing to proceed?" STAFF: Did the patient verbally acknowledge consent to telehealth visit? Document  YES/NO here: YES  4. Advise patient to be prepared - "Two hours prior to your appointment, go ahead and check your blood pressure, pulse, oxygen saturation, and your weight (if you have the equipment to check those) and write them all down. When your visit starts, your provider will ask you for this information. If you have an Apple Watch or Kardia device, please plan to have heart rate information ready on the day of your appointment. Please have a pen and paper handy nearby the day of the visit as well."  5. Give patient instructions for MyChart download to smartphone OR Doximity/Doxy.me as below if video visit (depending on what platform provider is using)  6. Inform patient they will receive a phone call 15 minutes prior to their appointment time (may be from unknown caller ID) so they should be prepared to answer    TELEPHONE CALL NOTE  Ellen Morris has been deemed a candidate for a follow-up tele-health visit to limit community exposure during the Covid-19 pandemic. I spoke with the patient via phone to ensure availability of phone/video source, confirm preferred email & phone number, and discuss instructions and expectations.  I reminded Ellen Morris to be prepared with any vital sign and/or heart rhythm information that could potentially be obtained via home monitoring, at the time of her visit. I reminded Ellen Morris to expect a phone call prior to her visit.  [name], CMA 08/10/2019 9:26 AM   INSTRUCTIONS FOR DOWNLOADING THE MYCHART APP TO SMARTPHONE  - The patient must first make sure to have activated MyChart and know their login information - If Apple, go to [employer] and type in MyChart in the search bar and download the app. If Android, ask patient to go to [employer] and type in [place] in the search bar and download the app. The app is free but as with any other app downloads, their phone may require them to verify saved payment information or  Apple/Android password.  - The patient will need to then log into the app with their MyChart username and password, and select [facility] as their healthcare provider to link the account. When it is time for your visit, go to the MyChart app, find appointments, and click Begin Video Visit. Be sure to Select Allow for your device to access the Microphone and Camera for your visit. You will then be connected, and your provider will be with you shortly.  **If they have any issues connecting, or need assistance please contact MyChart service desk [phone])**  **If using a computer, in order to ensure the best quality for their visit they will need to use either of the following Internet Browsers: [employer] or Google Chrome**  IF USING DOXIMITY or DOXY.ME - The patient will receive a link just prior to their visit by text.     FULL LENGTH CONSENT FOR TELE-HEALTH VISIT   I hereby voluntarily request, consent and authorize [facility] and its employed or contracted physicians, physician assistants, nurse practitioners or other licensed health care professionals (the Practitioner), to provide me with telemedicine health care services (the "Services") as deemed necessary by the treating Practitioner. I acknowledge and consent to receive the Services by the Practitioner via telemedicine. I understand that the telemedicine visit will involve communicating with the Practitioner through live audiovisual communication technology and the disclosure of certain medical information by electronic transmission. I acknowledge that I have been given the opportunity to request an in-person assessment or other available alternative prior to the telemedicine visit and am voluntarily participating in the telemedicine visit.  I understand that I have the right to withhold or withdraw my consent to the use of telemedicine in the course of my care at any time, without affecting my right to future care  or treatment, and that the Practitioner or I may terminate the telemedicine visit at any time. I understand that I have the right to inspect all information obtained and/or recorded in the course of the telemedicine visit and may receive copies of available information for a reasonable fee.  I understand that some of the potential risks of receiving the Services via telemedicine include:  Marland Kitchen Delay or interruption in medical evaluation due to technological equipment failure or disruption; . Information transmitted may not be sufficient (e.g. poor resolution of images) to allow for appropriate medical decision making by the Practitioner; and/or  . In rare instances, security protocols could fail, causing a breach of personal health information.  Furthermore, I acknowledge that it is my responsibility to provide information about my medical history, conditions and care that is complete and accurate to the best of my ability. I acknowledge that Practitioner's advice, recommendations, and/or decision may be based on factors not within their control, such as incomplete or inaccurate data provided by me or distortions of diagnostic images or specimens that may result from electronic transmissions. I understand that the  practice of medicine is not an Chief Strategy Officer and that Practitioner makes no warranties or guarantees regarding treatment outcomes. I acknowledge that I will receive a copy of this consent concurrently upon execution via email to the email address I last provided but may also request a printed copy by calling the office of [facility].    I understand that my insurance will be billed for this visit.   I have read or had this consent read to me. . I understand the contents of this consent, which adequately explains the benefits and risks of the Services being provided via telemedicine.  . I have been provided ample opportunity to ask questions regarding this consent and the Services and have had  my questions answered to my satisfaction. . I give my informed consent for the services to be provided through the use of telemedicine in my medical care  By participating in this telemedicine visit I agree to the above.

## 2019-08-10 NOTE — Patient Instructions (Addendum)
Medication Instructions:  Your physician recommends that you continue on your current medications as directed. Please refer to the Current Medication list given to you today.  *If you need a refill on your cardiac medications before your next appointment, please call your pharmacy*  Lab Work: NONE ordered at this time of appointment   If you have labs (blood work) drawn today and your tests are completely normal, you will receive your results only by: Marland Kitchen MyChart Message (if you have MyChart) OR . A paper copy in the mail If you have any lab test that is abnormal or we need to change your treatment, we will call you to review the results.  Testing/Procedures: NONE ordered at this time of appointment   Follow-Up: At [facility], you and your health needs are our priority.  As part of our continuing mission to provide you with exceptional heart care, we have created designated Provider Care Teams.  These Care Teams include your primary Cardiologist (physician) and Advanced Practice Providers (APPs -  Physician Assistants and Nurse Practitioners) who all work together to provide you with the care you need, when you need it.  We recommend signing up for the patient portal called "MyChart".  Sign up information is provided on this After Visit Summary.  MyChart is used to connect with patients for Virtual Visits (Telemedicine).  Patients are able to view lab/test results, encounter notes, upcoming appointments, etc.  Non-urgent messages can be sent to your provider as well.   To learn more about what you can do with MyChart, go to [email or web address].    Your next appointment:   1-2 month(s)  The format for your next appointment:   In Person  Provider:   You may see Olga Millers, MD or one of the following Advanced Practice Providers on your designated Care Team:    Corine Shelter, PA-C  [employer], [place]  Edd Fabian, [place]  Other Instructions  Please contact your Primary  Care Doctor Immediately to reestablish care

## 2019-08-10 NOTE — Progress Notes (Signed)
Virtual Visit via Telephone Note   This visit type was conducted due to national recommendations for restrictions regarding the COVID-19 Pandemic (e.g. social distancing) in an effort to limit this patient's exposure and mitigate transmission in our community.  Due to her co-morbid illnesses, this patient is at least at moderate risk for complications without adequate follow up.  This format is felt to be most appropriate for this patient at this time.  The patient did not have access to video technology/had technical difficulties with video requiring transitioning to audio format only (telephone).  All issues noted in this document were discussed and addressed.  No physical exam could be performed with this format.  Please refer to the patient's chart for her  consent to telehealth for [facility].   The patient was identified using 2 identifiers.  Date:  08/10/2019   ID:  Ellen Morris, DOB [birth year], MRN [id number]  Patient Location: Home Provider Location: Home  PCP:  Debbrah Alar, NP  Cardiologist:   Electrophysiologist:  None   Evaluation Performed:  Follow-Up Visit  Chief Complaint:  none  History of Present Illness:    Ellen Morris is a 69 y.o. female with history of coronary disease, status post OM 2 PCI in 2008.  She had a low risk stress echo in March 2018.  That was the last time we saw her in the office.  Other medical issues include non-insulin-dependent diabetes, hypertension, and dyslipidemia.  She was contacted today for routine follow-up.  The patient says she is not had any chest pain issues.  She has been caring for an elderly family member at home.  Unfortunately she has not been to her PCP in over a year.  She is out of all her medications.  She admitted that "I have been taking care of someone else not myself".  The patient does not have symptoms concerning for COVID-19 infection (fever, chills, cough, or new shortness of breath).    Past Medical  History:  Diagnosis Date  . CAD (coronary artery disease) 03/05/2016  . Diabetes mellitus without complication ([place])   . Goiter   . Hypertension    Past Surgical History:  Procedure Laterality Date  . CORONARY ANGIOPLASTY WITH STENT PLACEMENT       Current Meds  Medication Sig  . aspirin EC 81 MG tablet Take 81 mg by mouth daily.     Allergies:   Metformin, Atorvastatin, and Lisinopril   Social History   Tobacco Use  . Smoking status: Never Smoker  . Smokeless tobacco: Never Used  Substance Use Topics  . Alcohol use: Yes    Comment: occasional wine  . Drug use: No     Family Hx: The patient's family history includes Diabetes in her mother; Goiter in her paternal grandmother; Heart disease in her father; Hypertension in her mother.  ROS:   Please see the history of present illness.   Going for her COVID vaccine today  All other systems reviewed and are negative.   Prior CV studies:   The following studies were reviewed today: Stress echo 2018  Labs/Other Tests and Data Reviewed:    EKG:  An ECG dated 10/18/2016 was personally reviewed today and demonstrated:  NSR-HR 85  Recent Labs: No results found for requested labs within last 8760 hours.   Recent Lipid Panel Lab Results  Component Value Date/Time   CHOL 185 09/16/2016 11:47 AM   TRIG 135 09/16/2016 11:47 AM   HDL 70 09/16/2016 11:47 AM  CHOLHDL 2.6 09/16/2016 11:47 AM   LDLCALC 88 09/16/2016 11:47 AM    Wt Readings from Last 3 Encounters:  08/10/19 134 lb 6.4 oz (61 kg)  10/18/16 146 lb (66.2 kg)  07/25/16 146 lb (66.2 kg)     Objective:    Vital Signs:  Ht 5\' 2"  (1.575 m)   Wt 134 lb 6.4 oz (61 kg)   BMI 24.58 kg/m    VITAL SIGNS:  reviewed  ASSESSMENT & PLAN:    CAD- H/O OM2 PCI in 2008- negative stress echo in 2018.  No chest pain or unusual dyspnea currently.  Non compliance- Pt has been out of all medication for some time- months- strongly encouraged to in touch with her PCP for  follow up ASAP.   HTN- No recent readings  DM- On no medications   Plan:  Urged to contact her PCP for f/u ASAP.  I will arrange an OV in 1 month.   COVID-19 Education: The signs and symptoms of COVID-19 were discussed with the patient and how to seek care for testing (follow up with PCP or arrange E-visit).  The importance of social distancing was discussed today.  Time:   Today, I have spent 15 minutes with the patient with telehealth technology discussing the above problems.     Medication Adjustments/Labs and Tests Ordered: Current medicines are reviewed at length with the patient today.  Concerns regarding medicines are outlined above.   Tests Ordered: No orders of the defined types were placed in this encounter.   Medication Changes: No orders of the defined types were placed in this encounter.   Follow Up:  In Person one month with Dr 2019 or APP  Signed, Jens Som, PA-C  08/10/2019 9:11 AM    [facility]

## 2019-08-24 ENCOUNTER — Ambulatory Visit: Payer: Medicare Other | Admitting: Family

## 2019-09-23 NOTE — Progress Notes (Deleted)
HPI: FU coronary artery disease. Patient had drug-eluting stent to second marginal in 2008.Stress echocardiogram March 2018 showed no ischemia.   Patient had CVA April 2021.  Echocardiogram showed normal LV function with mild left ventricular hypertrophy, trace mitral and tricuspid regurgitation and negative saline microcavitation study.  CTA showed no large vessel occlusion and there was note of multinodular goiter and thyroid ultrasound suggested.  Follow-up Holter monitor showed sinus rhythm, PVCs, couplets, PACs and 5 beats of SVT.  Since last seen   Current Outpatient Medications  Medication Sig Dispense Refill  . aspirin EC 81 MG tablet Take 81 mg by mouth daily.    Marland Kitchen glimepiride (AMARYL) 2 MG tablet TAKE 1 TABLET BY MOUTH ONCE DAILY WITH BREAKFAST (Patient not taking: Reported on 08/10/2019) 30 tablet 0  . JANUVIA 100 MG tablet TAKE ONE TABLET BY MOUTH ONCE DAILY (Patient not taking: Reported on 08/10/2019) 30 tablet 0  . losartan (COZAAR) 50 MG tablet Take 1 tablet (50 mg total) by mouth 2 (two) times daily. (Patient not taking: Reported on 08/10/2019) 180 tablet 3  . ONE TOUCH ULTRA TEST test strip Check blood sugar 2 times daily.    Letta Pate DELICA LANCETS 33G MISC Use to check blood sugar twice a day.    . rosuvastatin (CRESTOR) 20 MG tablet TAKE 1 TABLET BY MOUTH ONCE DAILY (Patient not taking: Reported on 08/10/2019) 90 tablet 3   No current facility-administered medications for this visit.     Past Medical History:  Diagnosis Date  . CAD (coronary artery disease) 03/05/2016  . Diabetes mellitus without complication ([facility])   . Goiter   . Hypertension     Past Surgical History:  Procedure Laterality Date  . CORONARY ANGIOPLASTY WITH STENT PLACEMENT      Social History   Socioeconomic History  . Marital status: Divorced    Spouse name: Not on file  . Number of children: 4  . Years of education: Not on file  . Highest education level: Not on file  Occupational  History  . Not on file  Tobacco Use  . Smoking status: Never Smoker  . Smokeless tobacco: Never Used  Substance and Sexual Activity  . Alcohol use: Yes    Comment: occasional wine  . Drug use: No  . Sexual activity: Not on file  Other Topics Concern  . 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    Social Determinants of Health   Financial Resource Strain:   . Difficulty of Paying Living Expenses:   Food Insecurity:   . Worried About Programme researcher, broadcasting/film/video in the Last Year:   . Barista in the Last Year:   Transportation Needs:   . Freight forwarder (Medical):   Marland Kitchen Lack of Transportation (Non-Medical):   Physical Activity:   . Days of Exercise per Week:   . Minutes of Exercise per Session:   Stress:   . Feeling of Stress :   Social Connections:   . Frequency of Communication with Friends and Family:   . Frequency of Social Gatherings with Friends and Family:   . Attends Religious Services:   . Active Member of Clubs or Organizations:   . Attends Banker Meetings:   Marland Kitchen Marital Status:   Intimate  Partner Violence:   . Fear of Current or Ex-Partner:   . Emotionally Abused:   Marland Kitchen Physically Abused:   . Sexually Abused:     Family History  Problem Relation Age of Onset  . Diabetes Mother   . Hypertension Mother   . Heart disease Father   . Goiter Paternal Grandmother     ROS: no fevers or chills, productive cough, hemoptysis, dysphasia, odynophagia, melena, hematochezia, dysuria, hematuria, rash, seizure activity, orthopnea, PND, pedal edema, claudication. Remaining systems are negative.  Physical Exam: Well-developed well-nourished in no acute distress.  Skin is warm and dry.  HEENT is normal.  Neck is supple.  Chest is clear to auscultation with normal expansion.  Cardiovascular exam is regular rate and  rhythm.  Abdominal exam nontender or distended. No masses palpated. Extremities show no edema. neuro grossly intact  ECG- personally reviewed  A/P  1 coronary artery disease-patient denies chest pain.  Plan to continue medical therapy with aspirin and statin.  2 hypertension-blood pressure controlled.  Continue present medications.  3 hyperlipidemia-continue statin.  4 recent CVA-patient was treated at [facility].  Will arrange implantable loop monitor to exclude atrial fibrillation.  Kirk Ruths, MD

## 2019-09-27 ENCOUNTER — Ambulatory Visit: Payer: Medicare Other | Admitting: Cardiology

## 2019-10-19 ENCOUNTER — Ambulatory Visit: Payer: Medicare Other | Admitting: Family
